# Patient Record
Sex: Female | Born: 1990 | Hispanic: Yes | Smoking: Never smoker
Health system: Southern US, Community
[De-identification: ages and names within clinical notes are randomized; demographics above are authoritative.]

## PROBLEM LIST (undated history)

## (undated) DIAGNOSIS — R87619 Unspecified abnormal cytological findings in specimens from cervix uteri: Secondary | ICD-10-CM

## (undated) DIAGNOSIS — R112 Nausea with vomiting, unspecified: Secondary | ICD-10-CM

## (undated) DIAGNOSIS — K0889 Other specified disorders of teeth and supporting structures: Secondary | ICD-10-CM

## (undated) HISTORY — DX: Other specified disorders of teeth and supporting structures: K08.89

## (undated) HISTORY — DX: Nausea with vomiting, unspecified: R11.2

## (undated) HISTORY — DX: Unspecified abnormal cytological findings in specimens from cervix uteri: R87.619

---

## 2018-08-13 LAB — OB RESULTS CONSOLE HGB/HCT, BLOOD: Hemoglobin: 13.2

## 2018-08-14 LAB — OB RESULTS CONSOLE RPR: RPR: NONREACTIVE

## 2018-08-14 LAB — OB RESULTS CONSOLE ABO/RH
ABO/RH(D): O POS
RH Type: POSITIVE

## 2018-08-14 LAB — OB RESULTS CONSOLE HGB/HCT, BLOOD: HCT: 38 (ref 29–41)

## 2018-08-14 LAB — OB RESULTS CONSOLE HEPATITIS B SURFACE ANTIGEN: Hepatitis B Surface Ag: NEGATIVE

## 2018-08-14 LAB — OB RESULTS CONSOLE GC/CHLAMYDIA
Chlamydia: NEGATIVE
Gonorrhea: NEGATIVE

## 2018-08-14 LAB — OB RESULTS CONSOLE RUBELLA ANTIBODY, IGM: Rubella: IMMUNE

## 2018-08-14 LAB — OB RESULTS CONSOLE PLATELET COUNT: Platelets: 401000

## 2018-08-14 LAB — OB RESULTS CONSOLE ANTIBODY SCREEN: Antibody Screen: NEGATIVE

## 2018-08-14 LAB — OB RESULTS CONSOLE VARICELLA ZOSTER ANTIBODY, IGG: Varicella: IMMUNE

## 2018-08-14 LAB — OB RESULTS CONSOLE HIV ANTIBODY (ROUTINE TESTING): HIV: NONREACTIVE

## 2018-08-15 ENCOUNTER — Other Ambulatory Visit: Payer: Self-pay | Admitting: Advanced Practice Midwife

## 2018-08-15 DIAGNOSIS — Z3481 Encounter for supervision of other normal pregnancy, first trimester: Secondary | ICD-10-CM

## 2018-08-22 ENCOUNTER — Other Ambulatory Visit: Payer: Self-pay

## 2018-08-22 ENCOUNTER — Ambulatory Visit
Admission: RE | Admit: 2018-08-22 | Discharge: 2018-08-22 | Disposition: A | Discharge status: Home / Self Care | Payer: Self-pay | Source: Ambulatory Visit | Attending: Advanced Practice Midwife | Admitting: Advanced Practice Midwife

## 2018-08-22 DIAGNOSIS — Z3481 Encounter for supervision of other normal pregnancy, first trimester: Secondary | ICD-10-CM | POA: Insufficient documentation

## 2018-09-10 ENCOUNTER — Other Ambulatory Visit: Payer: Self-pay | Admitting: Advanced Practice Midwife

## 2018-09-10 DIAGNOSIS — Z3482 Encounter for supervision of other normal pregnancy, second trimester: Secondary | ICD-10-CM

## 2018-09-24 ENCOUNTER — Ambulatory Visit: Payer: Self-pay

## 2018-10-04 ENCOUNTER — Other Ambulatory Visit: Payer: Self-pay

## 2018-10-04 ENCOUNTER — Ambulatory Visit
Admission: RE | Admit: 2018-10-04 | Discharge: 2018-10-04 | Disposition: A | Discharge status: Home / Self Care | Payer: Self-pay | Source: Ambulatory Visit | Attending: Advanced Practice Midwife | Admitting: Advanced Practice Midwife

## 2018-10-04 DIAGNOSIS — Z3482 Encounter for supervision of other normal pregnancy, second trimester: Secondary | ICD-10-CM | POA: Insufficient documentation

## 2018-10-08 LAB — OB RESULTS CONSOLE HGB/HCT, BLOOD
HCT: 34 (ref 29–41)
Hemoglobin: 11.6

## 2018-10-08 LAB — OB RESULTS CONSOLE PLATELET COUNT: Platelets: 374000

## 2018-12-03 DIAGNOSIS — Z3482 Encounter for supervision of other normal pregnancy, second trimester: Secondary | ICD-10-CM

## 2018-12-03 DIAGNOSIS — Z3480 Encounter for supervision of other normal pregnancy, unspecified trimester: Secondary | ICD-10-CM | POA: Insufficient documentation

## 2018-12-03 DIAGNOSIS — D72829 Elevated white blood cell count, unspecified: Secondary | ICD-10-CM | POA: Insufficient documentation

## 2018-12-03 DIAGNOSIS — Z98891 History of uterine scar from previous surgery: Secondary | ICD-10-CM | POA: Insufficient documentation

## 2018-12-03 DIAGNOSIS — Z550 Illiteracy and low-level literacy: Secondary | ICD-10-CM

## 2018-12-03 DIAGNOSIS — Z6790 Unspecified blood type, Rh positive: Secondary | ICD-10-CM | POA: Insufficient documentation

## 2018-12-10 ENCOUNTER — Other Ambulatory Visit (HOSPITAL_COMMUNITY): Payer: Self-pay | Admitting: Family Medicine

## 2018-12-10 ENCOUNTER — Other Ambulatory Visit: Payer: Self-pay

## 2018-12-10 ENCOUNTER — Ambulatory Visit: Payer: Self-pay | Admitting: Physician Assistant

## 2018-12-10 VITALS — BP 95/57 | Temp 97.4°F | Wt 113.4 lb

## 2018-12-10 DIAGNOSIS — Z550 Illiteracy and low-level literacy: Secondary | ICD-10-CM

## 2018-12-10 DIAGNOSIS — D72829 Elevated white blood cell count, unspecified: Secondary | ICD-10-CM

## 2018-12-10 DIAGNOSIS — Z98891 History of uterine scar from previous surgery: Secondary | ICD-10-CM

## 2018-12-10 DIAGNOSIS — Z3403 Encounter for supervision of normal first pregnancy, third trimester: Secondary | ICD-10-CM

## 2018-12-10 DIAGNOSIS — O99810 Abnormal glucose complicating pregnancy: Secondary | ICD-10-CM

## 2018-12-10 DIAGNOSIS — Z3480 Encounter for supervision of other normal pregnancy, unspecified trimester: Secondary | ICD-10-CM

## 2018-12-10 LAB — OB RESULTS CONSOLE HIV ANTIBODY (ROUTINE TESTING): HIV: NONREACTIVE

## 2018-12-10 MED ORDER — PRENATAL VITAMINS 28-0.8 MG PO TABS: 1.0000 | ORAL_TABLET | Freq: Every day | ORAL | 0 refills | Status: DC

## 2018-12-10 NOTE — Progress Notes (Signed)
Negative responses to Covid-19 screening questions. Denies international travel for self or FOB since pregnant. 28 week labs today. Counseled regarding Tdap recommendations for those people who will be around infant. Client tolerated Tdap without difficulty.

## 2018-12-11 LAB — CBC WITH DIFFERENTIAL/PLATELET
Basophils Absolute: 0 10*3/uL (ref 0.0–0.2)
Basos: 0 %
EOS (ABSOLUTE): 0.6 10*3/uL — ABNORMAL HIGH (ref 0.0–0.4)
Eos: 5 %
Hematocrit: 34.4 % (ref 34.0–46.6)
Hemoglobin: 11.3 g/dL (ref 11.1–15.9)
Immature Grans (Abs): 0.1 10*3/uL (ref 0.0–0.1)
Immature Granulocytes: 1 %
Lymphocytes Absolute: 2.6 10*3/uL (ref 0.7–3.1)
Lymphs: 23 %
MCH: 30.6 pg (ref 26.6–33.0)
MCHC: 32.8 g/dL (ref 31.5–35.7)
MCV: 93 fL (ref 79–97)
Monocytes Absolute: 0.8 10*3/uL (ref 0.1–0.9)
Monocytes: 7 %
Neutrophils Absolute: 7.4 10*3/uL — ABNORMAL HIGH (ref 1.4–7.0)
Neutrophils: 64 %
Platelets: 358 10*3/uL (ref 150–450)
RBC: 3.69 x10E6/uL — ABNORMAL LOW (ref 3.77–5.28)
RDW: 12.7 % (ref 11.7–15.4)
WBC: 11.5 10*3/uL — ABNORMAL HIGH (ref 3.4–10.8)

## 2018-12-11 LAB — GLUCOSE, 1 HOUR GESTATIONAL: Gestational Diabetes Screen: 147 mg/dL — ABNORMAL HIGH (ref 65–139)

## 2018-12-11 LAB — RPR: RPR Ser Ql: NONREACTIVE

## 2018-12-12 ENCOUNTER — Telehealth: Payer: Self-pay

## 2018-12-12 DIAGNOSIS — O99810 Abnormal glucose complicating pregnancy: Secondary | ICD-10-CM | POA: Insufficient documentation

## 2018-12-12 LAB — HEMOGLOBIN, FINGERSTICK: Hemoglobin: 11.4 g/dL (ref 11.1–15.9)

## 2018-12-12 NOTE — Telephone Encounter (Signed)
Pt. Returned call-discussed 3 Hr Gtt and is scheduled for 12/19/18.  Aware to be fasting; Interpreter by Jerilynn Mages. [place].

## 2018-12-12 NOTE — Telephone Encounter (Signed)
Needs 3 Hr Gtt--scheduled appt for 12/19/18 @ 8:15 if that will work.

## 2018-12-13 NOTE — Progress Notes (Signed)
  PRENATAL VISIT NOTE  Subjective:  Angela Terry is a 28 y.o. G2P1001 at [redacted]w[redacted]d being seen today for ongoing prenatal care.  She is currently monitored for the following issues for this low-risk pregnancy and has Supervision of other normal pregnancy, antepartum; Previous cesarean section; Elevated white blood cell count; Blood type, Rh positive; Illiteracy; and Abnormal glucose tolerance affecting pregnancy, antepartum on their problem list.  Patient reports no complaints.  Contractions: Not present. Vag. Bleeding: None.  Movement: Present. denies leaking of fluid/ROM.   The following portions of the patient's history were reviewed and updated as appropriate: allergies, current medications, past family history, past medical history, past social history, past surgical history and problem list. Problem list updated.  Objective:   Vitals:   12/10/18 1615  BP: (!) 95/57  Temp: (!) 97.4 F (36.3 C)  Weight: 113 lb 6.4 oz (51.4 kg)    Fetal Status: Fetal Heart Rate (bpm): 156 Fundal Height: 28 cm Movement: Present     General:  Alert, oriented and cooperative. Patient is in no acute distress.  Skin: Skin is warm and dry. No rash noted.   Cardiovascular: Normal heart rate noted  Respiratory: Normal respiratory effort, no problems with respiration noted  Abdomen: Soft, gravid, appropriate for gestational age.  Pain/Pressure: Absent     Pelvic: Cervical exam deferred        Extremities: Normal range of motion.  Edema: None  Mental Status: Normal mood and affect. Normal behavior. Normal judgment and thought content.   Assessment and Plan:  Pregnancy: G2P1001 at [redacted]w[redacted]d  1. Supervision of other normal pregnancy, antepartum Here for routine visit, feeling well, no concerns. - Tdap vaccine greater than or equal to 7yo IM - CBC with Differential/Platelet - HIV/HCV [facility] - RPR - Prenatal Vit-Fe Fumarate-FA (PRENATAL VITAMINS) 28-0.8 MG TABS; Take 1 tablet by mouth daily at  6 (six) AM.  Dispense: 100 tablet; Refill: 0 - Hemoglobin - Glucose, 1 hour gestational  2. Leukocytosis, unspecified type Repeated CBC 6/29 and reviewed after visit, WNL. No F/u needed for now.  3. Previous cesarean section Desires repeat C/S; pt will need del plans appt at [facility] @ 32 wk  4. Illiteracy Strong historian and verbal communication via interpreter.  5. Abnormal glucose tolerance affecting pregnancy, antepartum [place] reviewed after visit and elevated; sched 3h OGTT.   Preterm labor symptoms and general obstetric precautions including but not limited to vaginal bleeding, contractions, leaking of fluid and fetal movement were reviewed in detail with the patient. Please refer to After Visit Summary for other counseling recommendations.  Return in about 2 weeks (around 12/24/2018) for Routine prenatal care, Telehealth.  Future Appointments  Date Time Provider [place]  12/19/2018  8:40 AM AC-MH NURSE AC-MAT None  12/24/2018 10:20 AM AC-MH PROVIDER AC-MAT None    Jettie Pagan, PA-C

## 2018-12-13 NOTE — Patient Instructions (Signed)
Tercer trimestre de embarazo Third Trimester of Pregnancy  El tercer trimestre comprende desde la semana28 hasta la semana40 (desde el mes7 hasta el mes9). En este trimestre, el beb en gestacin (feto) crece muy rpidamente. Hacia el final del noveno mes, el beb en gestacin mide alrededor de 20pulgadas (45cm) de largo. Pesa entre 6y 10libras (2,70y 4,50kg). Siga estas indicaciones en su casa: Medicamentos  Tome los medicamentos de venta libre y los recetados solamente como se lo haya indicado el mdico. Algunos medicamentos son seguros para tomar durante el embarazo y otros no lo son.  Tome vitaminas prenatales que contengan por lo menos 600microgramos (?g) de cido flico.  Si tiene dificultad para mover el intestino (estreimiento), tome un medicamento para ablandar las heces (laxante) si su mdico se lo autoriza. Comida y bebida   Ingiera alimentos saludables de manera regular.  No coma carne cruda ni quesos sin cocinar.  Si obtiene poca cantidad de calcio de los alimentos que ingiere, consulte a su mdico sobre la posibilidad de tomar un suplemento diario de calcio.  La ingesta diaria de cuatro o cinco comidas pequeas en lugar de tres comidas abundantes.  Evite el consumo de alimentos ricos en grasas y azcares, como los alimentos fritos y los dulces.  Para evitar el estreimiento: ? Consuma alimentos ricos en fibra, como frutas y verduras frescas, cereales integrales y frijoles. ? Beba suficiente lquido para mantener el pis (orina) claro o de color amarillo plido. Actividad  Haga ejercicios solamente como se lo haya indicado el mdico. Interrumpa la actividad fsica si comienza a tener calambres.  No levante objetos pesados, use zapatos de tacones bajos y sintese derecha.  No haga ejercicio si hace demasiado calor, hay demasiada humedad o se encuentra en un lugar de mucha altura (altitud alta).  Puede continuar teniendo relaciones sexuales, a menos que el  mdico le indique lo contrario. Alivio del dolor y del malestar  Use un sostn que le brinde buen soporte si sus mamas estn sensibles.  Haga pausas frecuentes y descanse con las piernas levantadas si tiene calambres en las piernas o dolor en la zona lumbar.  Dese baos de asiento con agua tibia para aliviar el dolor o las molestias causadas por las hemorroides. Use una crema para las hemorroides si el mdico la autoriza.  Si desarrolla venas hinchadas y abultadas (vrices) en las piernas: ? Use medias de compresin o medias de descanso como se lo haya indicado el mdico. ? Levante (eleve) los pies durante 15minutos, 3 o 4veces por da. ? Limite el consumo de sal en sus alimentos. Seguridad  Colquese el cinturn de seguridad cuando conduzca.  Haga una lista de los nmeros de telfono de emergencia, que incluya los nmeros de telfono de familiares, amigos, el hospital, as como los departamentos de polica y bomberos. Preparacin para la llegada del beb Para prepararse para la llegada de su beb:  Tome clases prenatales.  Practique ir manejando al hospital.  Visite el hospital y recorra el rea de maternidad.  Hable en su trabajo acerca de tomar licencia cuando llegue el beb.  Prepare el bolso que llevar al hospital.  Prepare la habitacin del beb.  Concurra a los controles mdicos.  Compre un asiento de seguridad orientado hacia atrs para llevar al beb en el automvil. Aprenda cmo instalarlo en el auto. Instrucciones generales  No se d baos de inmersin en agua caliente, baos turcos ni saunas.  No consuma ningn producto que contenga nicotina o tabaco, como cigarrillos y cigarrillos   electrnicos. Si necesita ayuda para dejar de fumar, consulte al mdico.  No beba alcohol.  No se haga duchas vaginales ni use tampones o toallas higinicas perfumadas.  No mantenga las piernas cruzadas durante mucho tiempo.  No haga viajes de larga distancia, excepto si es  obligatorio. Hgalos solamente si su mdico la autoriza.  Visite a su dentista si no lo ha hecho durante el embarazo. Use un cepillo de cerdas suaves para cepillarse los dientes. Psese el hilo dental con suavidad.  Evite el contacto con las bandejas sanitarias de los gatos y la tierra que estos animales usan. Estos elementos contienen bacterias que pueden causar defectos congnitos al beb y la posible prdida del beb (aborto espontneo) o la muerte fetal.  Concurra a todas las visitas prenatales como se lo haya indicado el mdico. Esto es importante. Comunquese con un mdico si:  No est segura de si est en trabajo de parto o si ha roto la bolsa de las aguas.  Tiene mareos.  Tiene clicos leves o siente presin en la parte baja del vientre.  Sufre un dolor persistente en el abdomen.  Sigue teniendo malestar estomacal, vomita o tiene heces lquidas.  Advierte un lquido con olor ftido que proviene de la vagina.  Siente dolor al orinar. Solicite ayuda de inmediato si:  Tiene fiebre.  Tiene una prdida de lquido por la vagina.  Tiene sangrado o pequeas prdidas vaginales.  Siente dolor intenso o clicos en el abdomen.  Aumenta o baja de peso rpidamente.  Tiene dificultades para recuperar el aliento y siente dolor en el pecho.  Sbitamente se le hinchan mucho el rostro, las manos, los tobillos, los pies o las piernas.  No ha sentido los movimientos del beb durante una hora.  Siente un dolor de cabeza intenso que no se alivia con medicamentos.  Tiene dificultad para ver.  Tiene prdida de lquido o le sale un chorro de lquido de la vagina antes de estar en la semana 37.  Tiene espasmos abdominales (contracciones) regulares antes de estar en la semana 37. Resumen  El tercer trimestre comprende desde la semana28 hasta la semana40 (desde el mes7 hasta el mes9). Esta es la poca en que el beb en gestacin crece muy rpidamente.  Siga los consejos del mdico  con respecto a los medicamentos, la alimentacin y la actividad.  Preprese para la llegada del beb tomando las clases prenatales, preparando todo lo que necesitar el beb, arreglando la habitacin del beb y concurriendo a los controles mdicos.  Solicite ayuda de inmediato si tiene sangrado por la vagina, siente dolor en el pecho o tiene dificultad para respirar, o si no ha sentido que su beb se mueve en el transcurso de ms de una hora. Esta informacin no tiene como fin reemplazar el consejo del mdico. Asegrese de hacerle al mdico cualquier pregunta que tenga. Document Released: 01/30/2013 Document Revised: 01/02/2017 Document Reviewed: 01/02/2017 Elsevier Patient Education  2020 Elsevier Inc.  

## 2018-12-19 ENCOUNTER — Other Ambulatory Visit: Payer: Self-pay

## 2018-12-19 ENCOUNTER — Other Ambulatory Visit: Payer: Self-pay | Admitting: Nurse Practitioner

## 2018-12-19 DIAGNOSIS — Z3480 Encounter for supervision of other normal pregnancy, unspecified trimester: Secondary | ICD-10-CM

## 2018-12-19 NOTE — Progress Notes (Signed)
In for 3 Hr Gtt; confirmed is fasting and instructions given.

## 2018-12-20 LAB — GLUCOSE TOLERANCE TEST, 6 HOUR
Glucose, 1 Hour GTT: 136 mg/dL (ref 65–199)
Glucose, 2 hour: 92 mg/dL (ref 65–139)
Glucose, 3 hour: 79 mg/dL (ref 65–109)
Glucose, GTT - Fasting: 71 mg/dL (ref 65–99)

## 2018-12-22 NOTE — Progress Notes (Signed)
Omitted delivery group from overview - added [facility].

## 2018-12-24 ENCOUNTER — Ambulatory Visit: Payer: Self-pay | Admitting: Nurse Practitioner

## 2018-12-24 ENCOUNTER — Other Ambulatory Visit: Payer: Self-pay

## 2018-12-24 DIAGNOSIS — Z3480 Encounter for supervision of other normal pregnancy, unspecified trimester: Secondary | ICD-10-CM

## 2018-12-24 DIAGNOSIS — O99810 Abnormal glucose complicating pregnancy: Secondary | ICD-10-CM

## 2018-12-24 NOTE — Progress Notes (Signed)
  Nursing Staff Provider  Office Location  ACHD Dating    Language  Spanish Anatomy US    Flu Vaccine   Genetic Screen  NIPS:   AFP:   First Screen:  Quad:    TDaP vaccine    Hgb A1C or  GTT Early  Third trimester   Rhogam     LAB RESULTS   Feeding Plan  Blood Type O/Positive/O positive (03/03 0000)   Contraception  Antibody Negative (03/03 0000)  Circumcision  Rubella Immune (03/03 0000)  Pediatrician   RPR Non Reactive (06/29 1714)   Support Person  HBsAg Negative (03/03 0000)   Prenatal Classes  HIV Non-reactive (03/03 0000)  BTL Consent  GBS  (For PCN allergy, check sensitivities)   VBAC Consent  Pap      Hgb Electro      CF   Delivery Group   SMA   Centering Group  N/A       STI clinic/screening visit  Subjective:  Angela Terry is a 28 y.o. female being seen today for an STI screening visit. The patient reports they do not have symptoms.  Patient has the following medical conditionshas Supervision of other normal pregnancy, antepartum; Previous cesarean section; Elevated white blood cell count; Blood type, Rh positive; Illiteracy; and Abnormal glucose tolerance affecting pregnancy, antepartum on their problem list.  Chief Complaint  Patient presents with  . Routine Prenatal Visit    Telehealth - [redacted] wks gestation    Patient reports  HPI   See flowsheet for further details and programmatic requirements.    The following portions of the patient's history were reviewed and updated as appropriate: allergies, current medications, past family history, past medical history, past social history, past surgical history and problem list. Problem list updated.  Objective:  There were no vitals filed for this visit.  Physical Exam    Assessment and Plan:  Angela Terry is a 28 y.o. female presenting to the [facility] Department for STI screening    1. Supervision of other normal pregnancy, antepartum Client doing well Denies any  additional concerns at this time Plan for in-house visit  Client verbalizes understanding and is in agreement with plan of care      No follow-ups on file.  Future Appointments  Date Time Provider [place]  01/07/2019 10:00 AM AC-MH PROVIDER AC-MAT None    Berniece Andreas, NP

## 2018-12-24 NOTE — Progress Notes (Signed)
Telephone call to patient for telehealth appt. Patient verified using 2 identifiers. Patient states she is at home and available to talk. Patient states she has had PTL education and the purple handout sheet. Needs CCNC and PHQ9 at next visit. Patient next visit scheduled, and patient available for provider call.Jenetta Downer, RN

## 2018-12-24 NOTE — Progress Notes (Signed)
STI clinic/screening visit  Subjective:  Angela Terry is a 28 y.o. female being seen today for an STI screening visit. The patient reports they do not have symptoms.  Patient has the following medical conditionshas Supervision of other normal pregnancy, antepartum; Previous cesarean section; Elevated white blood cell count; Blood type, Rh positive; Illiteracy; and Abnormal glucose tolerance affecting pregnancy, antepartum on their problem list.  Chief Complaint  Patient presents with  . Routine Prenatal Visit    Telehealth - [redacted] wks gestation    Patient reports  HPI   See flowsheet for further details and programmatic requirements.    The following portions of the patient's history were reviewed and updated as appropriate: allergies, current medications, past family history, past medical history, past social history, past surgical history and problem list. Problem list updated.  Objective:  There were no vitals filed for this visit.  Physical Exam    Assessment and Plan:  Angela Terry is a 28 y.o. female presenting to the [facility] Department for STI screening   TELEPHONE OBSTETRICS VISIT ENCOUNTER NOTE  I connected with@ on 12/24/18 at 10:20 AM EDT by telephone at home and verified that I am speaking with the correct person using two identifiers.   I discussed the limitations, risks, security and privacy concerns of performing an evaluation and management service by telephone and the availability of in person appointments. I also discussed with the patient that there may be a patient responsible charge related to this service. The patient expressed understanding and agreed to proceed.  Subjective:  Angela Terry is a 28 y.o. G2P1001 at [redacted]w[redacted]d being followed for ongoing prenatal care.  She is currently monitored for the following issues for this low-risk pregnancy and has Supervision of other normal pregnancy, antepartum;  Previous cesarean section; Elevated white blood cell count; Blood type, Rh positive; Illiteracy; and Abnormal glucose tolerance affecting pregnancy, antepartum on their problem list.  Patient reports no complaints. Reports fetal movement. Denies any contractions, bleeding or leaking of fluid.   The following portions of the patient's history were reviewed and updated as appropriate: allergies, current medications, past family history, past medical history, past social history, past surgical history and problem list.   Objective:   General:  Alert, oriented and cooperative.   Mental Status: Normal mood and affect perceived. Normal judgment and thought content.  Rest of physical exam deferred due to type of encounter  Assessment and Plan:  Pregnancy: G2P1001 at [redacted]w[redacted]d   Preterm labor symptoms and general obstetric precautions including but not limited to vaginal bleeding, contractions, leaking of fluid and fetal movement were reviewed in detail with the patient.  I discussed the assessment and treatment plan with the patient. The patient was provided an opportunity to ask questions and all were answered. The patient agreed with the plan and demonstrated an understanding of the instructions. The patient was advised to call back or seek an in-person office evaluation/go to the hospital for any urgent or concerning symptoms.  Please refer to After Visit Summary for other counseling recommendations.   I provided 6 minutes of non-face-to-face time during this encounter.  Return in about 2 weeks (around 01/07/2019) for routine prenatal care.  Future Appointments  Date Time Provider [place]  01/07/2019 10:00 AM AC-MH PROVIDER AC-MAT None    Berniece Andreas, NP

## 2019-01-07 ENCOUNTER — Other Ambulatory Visit: Payer: Self-pay

## 2019-01-07 ENCOUNTER — Ambulatory Visit: Payer: Self-pay | Admitting: Nurse Practitioner

## 2019-01-07 VITALS — BP 93/57 | Temp 97.6°F | Wt 116.6 lb

## 2019-01-07 DIAGNOSIS — Z3480 Encounter for supervision of other normal pregnancy, unspecified trimester: Secondary | ICD-10-CM

## 2019-01-07 NOTE — Progress Notes (Signed)
     PRENATAL VISIT NOTE  Subjective:  Angela Terry is a 28 y.o. G2P1001 at [redacted]w[redacted]d being seen today for ongoing prenatal care.  She is currently monitored for the following issues for this high-risk pregnancy and has Supervision of other normal pregnancy, antepartum; Previous cesarean section; Elevated white blood cell count; Blood type, Rh positive; Illiteracy; and Abnormal glucose tolerance affecting pregnancy, antepartum on their problem list.  Patient reports Questions about kick count cards.   .  .   . Denies leaking of fluid/ROM.   The following portions of the patient's history were reviewed and updated as appropriate: allergies, current medications, past family history, past medical history, past social history, past surgical history and problem list. Problem list updated.  Objective:   Vitals:   01/07/19 0952  BP: (!) 93/57  Temp: 97.6 F (36.4 C)  Weight: 116 lb 9.6 oz (52.9 kg)    Fetal Status:           General:  Alert, oriented and cooperative. Patient is in no acute distress.  Skin: Skin is warm and dry. No rash noted.   Cardiovascular: Normal heart rate noted  Respiratory: Normal respiratory effort, no problems with respiration noted  Abdomen: Soft, gravid, appropriate for gestational age.        Pelvic: Cervical exam deferred        Extremities: Normal range of motion.     Mental Status: Normal mood and affect. Normal behavior. Normal judgment and thought content.   Assessment and Plan:  Pregnancy: G2P1001 at [redacted]w[redacted]d  1. Supervision of other normal pregnancy  Discussed kick counts card OB ROS flowsheet completed Denies headaches and vaginal discharge  Preterm labor symptoms and general obstetric precautions including but not limited to vaginal bleeding, contractions, leaking of fluid and fetal movement were reviewed in detail with the patient. Please refer to After Visit Summary for other counseling recommendations.  No follow-ups on file.  No  future appointments.  Berniece Andreas, NP

## 2019-01-07 NOTE — Progress Notes (Signed)
Taking PNV QD, denies ED/hospital visits since last RV. Kick count cards and instructions given. CCNC and PHQ9 forms completed today. Needs [place] delivery plans appt ~ 36 weeks.  Hal Morales, RN

## 2019-01-21 ENCOUNTER — Ambulatory Visit: Payer: Self-pay | Admitting: Nurse Practitioner

## 2019-01-21 ENCOUNTER — Other Ambulatory Visit: Payer: Self-pay

## 2019-01-21 DIAGNOSIS — O99810 Abnormal glucose complicating pregnancy: Secondary | ICD-10-CM

## 2019-01-21 DIAGNOSIS — Z3480 Encounter for supervision of other normal pregnancy, unspecified trimester: Secondary | ICD-10-CM

## 2019-01-21 NOTE — Progress Notes (Signed)
   TELEPHONE OBSTETRICS VISIT ENCOUNTER NOTE  I connected with Angela Terry on 01/21/19 at  1:20 PM EDT by telephone at home and verified that I am speaking with the correct person using two identifiers.   I discussed the limitations, risks, security and privacy concerns of performing an evaluation and management service by telephone and the availability of in person appointments. I also discussed with the patient that there may be a patient responsible charge related to this service. The patient expressed understanding and agreed to proceed.  Subjective:  Angela Terry is a 28 y.o. G2P1001 at [redacted]w[redacted]d being followed for ongoing prenatal care.  She is currently monitored for the following issues for this low-risk pregnancy and has Supervision of other normal pregnancy, antepartum; Previous cesarean section; Elevated white blood cell count; Blood type, Rh positive; Illiteracy; and Abnormal glucose tolerance affecting pregnancy, antepartum on their problem list.  Patient reports no complaints. Reports fetal movement. Denies any contractions, bleeding or leaking of fluid.   The following portions of the patient's history were reviewed and updated as appropriate: allergies, current medications, past family history, past medical history, past social history, past surgical history and problem list.   Objective:   General:  Alert, oriented and cooperative.   Mental Status: Normal mood and affect perceived. Normal judgment and thought content.  Rest of physical exam deferred due to type of encounter  Assessment and Plan:  Pregnancy: G2P1001 at [redacted]w[redacted]d   1. Supervision of other normal pregnancy, antepartum Client admits to doing well Denies any complaints at this time   Preterm labor symptoms and general obstetric precautions including but not limited to vaginal bleeding, contractions, leaking of fluid and fetal movement were reviewed in detail with the patient.  I discussed  the assessment and treatment plan with the patient. The patient was provided an opportunity to ask questions and all were answered. The patient agreed with the plan and demonstrated an understanding of the instructions. The patient was advised to call back or seek an in-person office evaluation/go to the hospital for any urgent or concerning symptoms.  Please refer to After Visit Summary for other counseling recommendations.   I provided 7 minutes of non-face-to-face time during this encounter.  No follow-ups on file.  Future Appointments  Date Time Provider [place]  02/04/2019 10:40 AM AC-MH PROVIDER AC-MAT None    Berniece Andreas, NP

## 2019-01-21 NOTE — Progress Notes (Signed)
Telehealth phone call to patient. Identified patient as well as current location as home address. Patient states she is comfortable discussing prenatal care information over the phone and at current location. Patient states she tested positive for Covid-19 ~ 2  Weeks ago and is off of isolation as of yesterday. States her husband tested positive first but that was before she had symptoms and that child has tested negative. Patient states she does not have scales and home BP cuff to assess weight and BP at home. Patient states she is taking PNV and denies ED/hospital visits since last RV. 2 week MH RV scheduled for 02/04/2019 @ 10:40. Instructed patent to arrive at 10:20 for check in. Instructed patient to remain at this same phone number for provider to call and completed provider portion of Telehealth appt. Hal Morales, RN

## 2019-02-04 ENCOUNTER — Ambulatory Visit: Payer: Self-pay | Admitting: Advanced Practice Midwife

## 2019-02-04 ENCOUNTER — Encounter: Payer: Self-pay | Admitting: Advanced Practice Midwife

## 2019-02-04 ENCOUNTER — Other Ambulatory Visit: Payer: Self-pay

## 2019-02-04 VITALS — BP 102/58 | Temp 98.9°F | Wt 120.6 lb

## 2019-02-04 DIAGNOSIS — Z3480 Encounter for supervision of other normal pregnancy, unspecified trimester: Secondary | ICD-10-CM

## 2019-02-04 DIAGNOSIS — O99810 Abnormal glucose complicating pregnancy: Secondary | ICD-10-CM

## 2019-02-04 DIAGNOSIS — Z98891 History of uterine scar from previous surgery: Secondary | ICD-10-CM

## 2019-02-04 NOTE — Progress Notes (Signed)
   PRENATAL VISIT NOTE  Subjective:  Angela Terry is a 28 y.o. G2P1001 at [redacted]w[redacted]d being seen today for ongoing prenatal care.  She is currently monitored for the following issues for this low-risk pregnancy and has Supervision of other normal pregnancy, antepartum; Previous cesarean section; Elevated white blood cell count; Blood type, Rh positive; Illiteracy; and Abnormal glucose tolerance affecting pregnancy, antepartum on their problem list.  Patient reports no complaints.  Contractions: Not present. Vag. Bleeding: None.  Movement: Present. Denies leaking of fluid/ROM.   The following portions of the patient's history were reviewed and updated as appropriate: allergies, current medications, past family history, past medical history, past social history, past surgical history and problem list. Problem list updated.  Objective:   Vitals:   02/04/19 1043  BP: (!) 102/58  Temp: 98.9 F (37.2 C)  Weight: 120 lb 9.6 oz (54.7 kg)    Fetal Status: Fetal Heart Rate (bpm): 120 Fundal Height: 34 cm Movement: Present     General:  Alert, oriented and cooperative. Patient is in no acute distress.  Skin: Skin is warm and dry. No rash noted.   Cardiovascular: Normal heart rate noted  Respiratory: Normal respiratory effort, no problems with respiration noted  Abdomen: Soft, gravid, appropriate for gestational age.  Pain/Pressure: Absent     Pelvic: Cervical exam deferred        Extremities: Normal range of motion.  Edema: None  Mental Status: Normal mood and affect. Normal behavior. Normal judgment and thought content.   Assessment and Plan:  Pregnancy: G2P1001 at [redacted]w[redacted]d  1. Abnormal glucose tolerance affecting pregnancy, antepartum 3 hr GTT on 12/19/18=wnl  2. Supervision of other normal pregnancy, antepartum Feels well. Reviewed pregnancy sx and questions.  No car seat yet.  Desires DMPA pp for birth control.  Knows when to go to L&D - GBS Culture - Chlamydia/GC NAA,  Confirmation  3. Previous cesarean section Hx c/s for FTP at 40 2/7.  Pt desires repeat c/s.  Referral written for delivery plans appt with [facility]   Preterm labor symptoms and general obstetric precautions including but not limited to vaginal bleeding, contractions, leaking of fluid and fetal movement were reviewed in detail with the patient. Please refer to After Visit Summary for other counseling recommendations.  Return in about 1 week (around 02/11/2019) for routine PNC.  Future Appointments  Date Time Provider [place]  02/11/2019 10:40 AM [facility] PROVIDER [facility] None    Herbie Saxon, CNM

## 2019-02-04 NOTE — Progress Notes (Signed)
In for visit; taking PNV; declines self collection of cultures Abijah Roussel, RN  

## 2019-02-07 LAB — CHLAMYDIA/GC NAA, CONFIRMATION
Chlamydia trachomatis, NAA: NEGATIVE
Neisseria gonorrhoeae, NAA: NEGATIVE

## 2019-02-08 LAB — CULTURE, BETA STREP (GROUP B ONLY): Strep Gp B Culture: NEGATIVE

## 2019-02-11 ENCOUNTER — Ambulatory Visit: Payer: Self-pay | Admitting: Advanced Practice Midwife

## 2019-02-11 ENCOUNTER — Ambulatory Visit: Payer: Self-pay

## 2019-02-11 ENCOUNTER — Other Ambulatory Visit: Payer: Self-pay

## 2019-02-11 VITALS — BP 101/65 | Temp 98.7°F | Wt 122.4 lb

## 2019-02-11 DIAGNOSIS — Z98891 History of uterine scar from previous surgery: Secondary | ICD-10-CM

## 2019-02-11 DIAGNOSIS — Z3480 Encounter for supervision of other normal pregnancy, unspecified trimester: Secondary | ICD-10-CM

## 2019-02-11 DIAGNOSIS — O99810 Abnormal glucose complicating pregnancy: Secondary | ICD-10-CM

## 2019-02-11 MED ORDER — PRENATAL VITAMINS 28-0.8 MG PO TABS: 1.0000 | ORAL_TABLET | Freq: Every day | ORAL | 0 refills | Status: AC

## 2019-02-11 NOTE — Progress Notes (Signed)
   PRENATAL VISIT NOTE  Subjective:  Angela Terry is a 28 y.o. G2P1001 at [redacted]w[redacted]d being seen today for ongoing prenatal care.  She is currently monitored for the following issues for this low-risk pregnancy and has Supervision of other normal pregnancy, antepartum; Previous cesarean section; Elevated white blood cell count; Blood type, Rh positive; Illiteracy; and Abnormal glucose tolerance affecting pregnancy, antepartum on their problem list.  Patient reports no complaints.  Contractions: Not present. Vag. Bleeding: None.  Movement: Present. Denies leaking of fluid/ROM.   The following portions of the patient's history were reviewed and updated as appropriate: allergies, current medications, past family history, past medical history, past social history, past surgical history and problem list. Problem list updated.  Objective:   Vitals:   02/11/19 1032  BP: 101/65  Temp: 98.7 F (37.1 C)  Weight: 122 lb 6.4 oz (55.5 kg)    Fetal Status: Fetal Heart Rate (bpm): 130 Fundal Height: 35 cm Movement: Present  Presentation: Vertex  General:  Alert, oriented and cooperative. Patient is in no acute distress.  Skin: Skin is warm and dry. No rash noted.   Cardiovascular: Normal heart rate noted  Respiratory: Normal respiratory effort, no problems with respiration noted  Abdomen: Soft, gravid, appropriate for gestational age.  Pain/Pressure: Absent     Pelvic: Cervical exam deferred        Extremities: Normal range of motion.  Edema: None  Mental Status: Normal mood and affect. Normal behavior. Normal judgment and thought content.   Assessment and Plan:  Pregnancy: G2P1001 at [redacted]w[redacted]d  1. Abnormal glucose tolerance affecting pregnancy, antepartum WNL on 12/19/18  2. Supervision of other normal pregnancy, antepartum Feels well.  Has car seat.  Knows when to go to L&D - Prenatal Vit-Fe Fumarate-FA (PRENATAL VITAMINS) 28-0.8 MG TABS; Take 1 tablet by mouth daily.  Dispense: 100  tablet; Refill: 0  3. Previous cesarean section 2011 in [place].  Wants repeat c/s.  [place] delivery plans appt today  Term labor symptoms and general obstetric precautions including but not limited to vaginal bleeding, contractions, leaking of fluid and fetal movement were reviewed in detail with the patient. Please refer to After Visit Summary for other counseling recommendations.  Return in about 1 week (around 02/18/2019).  No future appointments.  Herbie Saxon, CNM

## 2019-02-11 NOTE — Progress Notes (Signed)
In for visit; taking PNV, more given; has [place] appt. Today Debera Lat, RN

## 2019-02-19 ENCOUNTER — Ambulatory Visit: Payer: Self-pay

## 2019-02-21 ENCOUNTER — Ambulatory Visit: Payer: Self-pay | Admitting: Physician Assistant

## 2019-02-21 ENCOUNTER — Encounter: Payer: Self-pay | Admitting: Physician Assistant

## 2019-02-21 ENCOUNTER — Other Ambulatory Visit: Payer: Self-pay

## 2019-02-21 ENCOUNTER — Encounter
Admission: RE | Admit: 2019-02-21 | Discharge: 2019-02-21 | Disposition: A | Discharge status: Home / Self Care | Payer: Self-pay | Source: Ambulatory Visit | Attending: Obstetrics and Gynecology | Admitting: Obstetrics and Gynecology

## 2019-02-21 VITALS — BP 100/63 | Temp 99.4°F | Wt 124.6 lb

## 2019-02-21 DIAGNOSIS — Z20828 Contact with and (suspected) exposure to other viral communicable diseases: Secondary | ICD-10-CM | POA: Diagnosis not present

## 2019-02-21 DIAGNOSIS — Z0184 Encounter for antibody response examination: Secondary | ICD-10-CM | POA: Insufficient documentation

## 2019-02-21 DIAGNOSIS — Z3480 Encounter for supervision of other normal pregnancy, unspecified trimester: Secondary | ICD-10-CM

## 2019-02-21 DIAGNOSIS — Z98891 History of uterine scar from previous surgery: Secondary | ICD-10-CM

## 2019-02-21 DIAGNOSIS — Z01812 Encounter for preprocedural laboratory examination: Secondary | ICD-10-CM | POA: Insufficient documentation

## 2019-02-21 LAB — BASIC METABOLIC PANEL
Anion gap: 10 (ref 5–15)
BUN: 6 mg/dL (ref 6–20)
CO2: 20 mmol/L — ABNORMAL LOW (ref 22–32)
Calcium: 8.9 mg/dL (ref 8.9–10.3)
Chloride: 106 mmol/L (ref 98–111)
Creatinine, Ser: 0.5 mg/dL (ref 0.44–1.00)
GFR calc Af Amer: >60 mL/min (ref >60)
GFR calc non Af Amer: >60 mL/min (ref >60)
Glucose, Bld: 71 mg/dL (ref 70–99)
Potassium: 3.7 mmol/L (ref 3.5–5.1)
Sodium: 136 mmol/L (ref 135–145)

## 2019-02-21 LAB — TYPE AND SCREEN
ABO/RH(D): O POS
Antibody Screen: NEGATIVE
Extend sample reason: UNDETERMINED

## 2019-02-21 LAB — CBC
HCT: 37 % (ref 36.0–46.0)
Hemoglobin: 12.4 g/dL (ref 12.0–15.0)
MCH: 29.9 pg (ref 26.0–34.0)
MCHC: 33.5 g/dL (ref 30.0–36.0)
MCV: 89.2 fL (ref 80.0–100.0)
Platelets: 338 10*3/uL (ref 150–400)
RBC: 4.15 MIL/uL (ref 3.87–5.11)
RDW: 13.2 % (ref 11.5–15.5)
WBC: 11.7 10*3/uL — ABNORMAL HIGH (ref 4.0–10.5)
nRBC: 0 % (ref 0.0–0.2)

## 2019-02-21 LAB — SARS CORONAVIRUS 2 (TAT 6-24 HRS): SARS Coronavirus 2: NEGATIVE

## 2019-02-21 NOTE — H&P (Signed)
Loneta Tamplin is a 28 y.o. female presenting for repeat LTCS . 39+2 week on  02/25/2019 Last c/s in [place]  OB History    Gravida  2   Para  1   Term  1   Preterm  0   AB  0   Living  1     SAB      TAB      Ectopic      Multiple      Live Births  1          Past Medical History:  Diagnosis Date  . Abnormal Pap smear of cervix    questionable hx of 2018 abnormal PAP at [facility]  . Dental neglect    last exam years ago  . Nausea & vomiting    due to pregnancy   Past Surgical History:  Procedure Laterality Date  . CESAREAN SECTION  02/2010   [place]   Family History: family history is not on file. Social History:  reports that she has never smoked. She has never used smokeless tobacco. She reports previous alcohol use. She reports that she does not use drugs.     Maternal Diabetes: No Genetic Screening: norecords Maternal Ultrasounds/Referrals: Normal Fetal Ultrasounds or other Referrals:  None Maternal Substance Abuse:  No Significant Maternal Medications:  None Significant Maternal Lab Results:  None Other Comments:  None  ROS History   Last menstrual period 05/17/2018. Exam Physical Exam  Prenatal labs: ABO, Rh: O/Positive/O positive (03/03 0000) Antibody: Negative (03/03 0000) Rubella: Immune (03/03 0000) RPR: Non Reactive (06/29 1714)  HBsAg: Negative (03/03 0000)  HIV: Non-reactive (06/29 0000)  GBS:   neg   Assessment/Plan: Elective repeat LTCS  covid screening  Pt has been counseled regarding risks of the procedure . All questions answered  Translator present    Gwen Her Zaden Sako 02/21/2019, 12:03 PM

## 2019-02-21 NOTE — Progress Notes (Signed)
   PRENATAL VISIT NOTE  Subjective:  Angela Terry is a 28 y.o. G2P1001 at [redacted]w[redacted]d being seen today for ongoing prenatal care.  She is currently monitored for the following issues for this low-risk pregnancy and has Supervision of other normal pregnancy, antepartum; Previous cesarean section; Elevated white blood cell count; Blood type, Rh positive; Illiteracy; and Abnormal glucose tolerance affecting pregnancy, antepartum on their problem list.  Patient reports no complaints.  Contractions: Not present. Vag. Bleeding: None.  Movement: Present. Denies leaking of fluid/ROM.   The following portions of the patient's history were reviewed and updated as appropriate: allergies, current medications, past family history, past medical history, past social history, past surgical history and problem list. Problem list updated.  Objective:   Vitals:   02/21/19 1540  BP: 100/63  Temp: 99.4 F (37.4 C)  Weight: 124 lb 9.6 oz (56.5 kg)    Fetal Status: Fetal Heart Rate (bpm): 144 Fundal Height: 39 cm Movement: Present  Presentation: Vertex  General:  Alert, oriented and cooperative. Patient is in no acute distress.  Skin: Skin is warm and dry. No rash noted.   Cardiovascular: Normal heart rate noted  Respiratory: Normal respiratory effort, no problems with respiration noted  Abdomen: Soft, gravid, appropriate for gestational age.  Pain/Pressure: Absent     Pelvic: Cervical exam deferred        Extremities: Normal range of motion.  Edema: None  Mental Status: Normal mood and affect. Normal behavior. Normal judgment and thought content.   Assessment and Plan:  Pregnancy: G2P1001 at [redacted]w[redacted]d  1. Supervision of other normal pregnancy, antepartum Doing well. Anticipate next clinic visit 6wk postpartum. Reviewed hosp visitation policy in light of pandemic. Plans to have FOB with her. Call if questions/concerns prior to delivery.  2. Previous cesarean section To keep C/S appt as sched  02/25/19.   Term labor symptoms and general obstetric precautions including but not limited to vaginal bleeding, contractions, leaking of fluid and fetal movement were reviewed in detail with the patient. Please refer to After Visit Summary for other counseling recommendations.  Return in about 7 weeks (around 04/11/2019) for routine postpartum exam.  No future appointments.  Lora Havens, PA-C

## 2019-02-21 NOTE — Patient Instructions (Addendum)
Your procedure is scheduled ZO:XWRUEAon:Monday 02/25/19  Su procedimiento est programado para: Report to  Presntese a: To find out your arrival time please call [phone]. Para saber su hora de llegada por favor llame al [phone] :  Remember: Instructions that are not followed completely may result in serious medical risk, up to and including death, or upon the discretion of your surgeon and anesthesiologist your surgery may need to be rescheduled.  Recuerde: Las instrucciones que no se siguen completamente Armed forces logistics/support/administrative officerpueden resultar en un riesgo de salud grave, incluyendo hasta la [place] o a discrecin de su cirujano y Scientific laboratory techniciananestesilogo, su ciruga se puede posponer.   __X__ 1. Do not eat food  after midnight. No gum chewing or hard candies.  No coma alimentos  despus de la medianoche.  No mastique chicle ni caramelos  Duros. Puede beber liquidos transparentes como agua, te, cafe negro, jugo de [place] Arthurmanzana     __X__ 2. No alcohol for 24 hours before or after surgery.    No tome alcohol durante las 24 horas antes ni despus de la [place].   ____ 3. Bring all medications with you on the day of surgery if instructed.    Lleve todos los medicamentos con usted el da de su ciruga si se le ha indicado as.   __X__ 4. Notify your doctor if there is any change in your medical condition (cold, fever,                             infections).    Informe a su mdico si hay algn cambio en su condicin mdica (resfriado, fiebre, infecciones).   Do not wear jewelry, make-up, hairpins, clips or nail polish.  No use joyas, maquillajes, pinzas/ganchos para el cabello ni esmalte de uas.  Do not wear lotions, powders, or perfumes. .  No use lociones, polvos o perfumes.  .    Do not shave 48 hours prior to surgery. Men may shave face and neck.  No se afeite 48 horas antes de la [place].  Los hombres pueden Commercial Metals Companyafeitarse la cara y el cuello.   Do not bring valuables to the hospital.   No lleve objetos de valor al  hospital.  [facility] not responsible [facility] any belongings or valuables.  [place] no se hace responsable de ningn tipo de pertenencias u objetos de Licensed conveyancervalor.               Contacts, dentures or bridgework may not be worn into surgery.  Los lentes de [place]contacto, las dentaduras postizas o puentes no se pueden usar en la [place].  Leave your suitcase in the car. After surgery it may be brought to your room.  Deje su maleta en el auto.  Despus de la ciruga podr traerla a su habitacin.  For patients admitted to the hospital, discharge time is determined by your treatment team.  Para los pacientes que sean ingresados al hospital, el tiempo en el cual se le dar de alta es determinado por su                equipo de [place].   Patients discharged the day of surgery will not be allowed to drive home. A los pacientes que se les da de alta el mismo da de la ciruga no se les permitir conducir a Higher education careers advisercasa.   Please read over the following fact sheets that you were given: Por favor lea las siguientes hojas de informacin  que le dieron:    ____ Take these medicines the morning of surgery with A SIP OF WATER:          Tome estas medicinas la maana de la ciruga con [place]:  1.   2.   3.   4.       5.  6.  ____ Fleet Enema (as directed)          Enema de Fleet (segn lo indicado)    __X__ Use CHG Soap as directed          Utilice el jabn de CHG segn lo indicado  ____ Use inhalers on the day of surgery          Use los inhaladores el da de la ciruga  ____ Stop metformin 2 days prior to surgery          Deje de tomar el metformin 2 das antes de la ciruga    ____ Take 1/2 of usual insulin dose the night before surgery and none on the morning of surgery           Tome la mitad de la dosis habitual de insulina la noche antes de la [place] y no tome nada en la maana de la             ciruga  ____ Stop Coumadin/Plavix/aspirin on           Deje de tomar el  Coumadin/Plavix/aspirina el da:  ____ Stop Anti-inflammatories on           Deje de tomar antiinflamatorios el da:   ____ Stop supplements until after surgery            Deje de tomar suplementos hasta despus de la ciruga  ____ Bring C-Pap to the hospital          [place] al hospital

## 2019-02-21 NOTE — Progress Notes (Signed)
In for visit; has C/S scheduled 02/25/19 & had Covid test today Debera Lat, RN

## 2019-02-23 ENCOUNTER — Encounter
Admission: EM | Disposition: A | Discharge status: Home / Self Care | Payer: Self-pay | Source: Home / Self Care | Attending: Obstetrics and Gynecology

## 2019-02-23 ENCOUNTER — Inpatient Hospital Stay
Admission: EM | Admit: 2019-02-23 | Discharge: 2019-02-25 | DRG: 788 | Disposition: A | Discharge status: Home / Self Care | Payer: Medicaid Other | Attending: Obstetrics and Gynecology | Admitting: Obstetrics and Gynecology

## 2019-02-23 ENCOUNTER — Inpatient Hospital Stay: Payer: Medicaid Other | Admitting: Anesthesiology

## 2019-02-23 DIAGNOSIS — Z98891 History of uterine scar from previous surgery: Secondary | ICD-10-CM

## 2019-02-23 DIAGNOSIS — O4292 Full-term premature rupture of membranes, unspecified as to length of time between rupture and onset of labor: Secondary | ICD-10-CM | POA: Diagnosis present

## 2019-02-23 DIAGNOSIS — O34211 Maternal care for low transverse scar from previous cesarean delivery: Secondary | ICD-10-CM | POA: Diagnosis present

## 2019-02-23 DIAGNOSIS — Z9889 Other specified postprocedural states: Secondary | ICD-10-CM

## 2019-02-23 DIAGNOSIS — Z3A39 39 weeks gestation of pregnancy: Secondary | ICD-10-CM | POA: Diagnosis not present

## 2019-02-23 LAB — CBC
HCT: 36 % (ref 36.0–46.0)
Hemoglobin: 12.3 g/dL (ref 12.0–15.0)
MCH: 30.1 pg (ref 26.0–34.0)
MCHC: 34.2 g/dL (ref 30.0–36.0)
MCV: 88.2 fL (ref 80.0–100.0)
Platelets: 326 10*3/uL (ref 150–400)
RBC: 4.08 MIL/uL (ref 3.87–5.11)
RDW: 13.3 % (ref 11.5–15.5)
WBC: 13.2 10*3/uL — ABNORMAL HIGH (ref 4.0–10.5)
nRBC: 0 % (ref 0.0–0.2)

## 2019-02-23 SURGERY — Surgical Case
Anesthesia: Spinal

## 2019-02-23 MED ORDER — TERBUTALINE SULFATE 1 MG/ML IJ SOLN
0.2500 mg | Freq: Once | INTRAMUSCULAR | Status: AC
  Administered 2019-02-23: 0.25 mg via SUBCUTANEOUS
  Filled 2019-02-23: qty 1

## 2019-02-23 MED ORDER — MORPHINE SULFATE (PF) 0.5 MG/ML IJ SOLN
INTRAMUSCULAR | Status: DC | PRN
  Administered 2019-02-23: .1 mg via INTRATHECAL

## 2019-02-23 MED ORDER — PHENYLEPHRINE HCL (PRESSORS) 10 MG/ML IV SOLN
INTRAVENOUS | Status: DC | PRN
  Administered 2019-02-23: 200 ug via INTRAVENOUS

## 2019-02-23 MED ORDER — ONDANSETRON HCL 4 MG/2ML IJ SOLN
INTRAMUSCULAR | Status: AC
  Filled 2019-02-23: qty 2

## 2019-02-23 MED ORDER — MORPHINE SULFATE (PF) 0.5 MG/ML IJ SOLN
INTRAMUSCULAR | Status: AC
  Filled 2019-02-23: qty 10

## 2019-02-23 MED ORDER — BUPIVACAINE LIPOSOME 1.3 % IJ SUSP
20.0000 mL | Freq: Once | INTRAMUSCULAR | Status: DC
  Filled 2019-02-23: qty 20

## 2019-02-23 MED ORDER — DEXAMETHASONE SODIUM PHOSPHATE 10 MG/ML IJ SOLN
INTRAMUSCULAR | Status: AC
  Filled 2019-02-23: qty 1

## 2019-02-23 MED ORDER — OXYTOCIN BOLUS FROM INFUSION: 500.0000 mL | Freq: Once | INTRAVENOUS | Status: DC

## 2019-02-23 MED ORDER — LACTATED RINGERS IV SOLN
INTRAVENOUS | Status: DC
  Administered 2019-02-23: 23:00:00 via INTRAVENOUS

## 2019-02-23 MED ORDER — OXYTOCIN 40 UNITS IN NORMAL SALINE INFUSION - SIMPLE MED
2.5000 [IU]/h | INTRAVENOUS | Status: DC
  Administered 2019-02-24: 1000 mL via INTRAVENOUS
  Filled 2019-02-23: qty 1000

## 2019-02-23 MED ORDER — SOD CITRATE-CITRIC ACID 500-334 MG/5ML PO SOLN
ORAL | Status: AC
  Administered 2019-02-23: 15 mL
  Filled 2019-02-23: qty 15

## 2019-02-23 MED ORDER — GABAPENTIN 600 MG PO TABS
300.0000 mg | ORAL_TABLET | Freq: Once | ORAL | Status: AC
  Administered 2019-02-23: 23:00:00 300 mg via ORAL
  Filled 2019-02-23: qty 0.5

## 2019-02-23 MED ORDER — FENTANYL CITRATE (PF) 100 MCG/2ML IJ SOLN
INTRAMUSCULAR | Status: AC
  Filled 2019-02-23: qty 2

## 2019-02-23 MED ORDER — BUPIVACAINE IN DEXTROSE 0.75-8.25 % IT SOLN
INTRATHECAL | Status: DC | PRN
  Administered 2019-02-23: 1.4 mL via INTRATHECAL

## 2019-02-23 MED ORDER — ACETAMINOPHEN 500 MG PO TABS: 1000.0000 mg | ORAL_TABLET | Freq: Once | ORAL | Status: DC

## 2019-02-23 MED ORDER — SODIUM CHLORIDE (PF) 0.9 % IJ SOLN
INTRAMUSCULAR | Status: AC
  Filled 2019-02-23: qty 50

## 2019-02-23 MED ORDER — FENTANYL CITRATE (PF) 100 MCG/2ML IJ SOLN
INTRAMUSCULAR | Status: DC | PRN
  Administered 2019-02-23: 15 ug via INTRATHECAL

## 2019-02-23 MED ORDER — LACTATED RINGERS IV SOLN: Freq: Once | INTRAVENOUS | Status: DC

## 2019-02-23 MED ORDER — BUPIVACAINE HCL (PF) 0.5 % IJ SOLN
INTRAMUSCULAR | Status: AC
  Filled 2019-02-23: qty 30

## 2019-02-23 MED ORDER — SODIUM CHLORIDE 0.9 % IV SOLN
INTRAVENOUS | Status: DC | PRN
  Administered 2019-02-23: 50 ug/min via INTRAVENOUS

## 2019-02-23 MED ORDER — CEFAZOLIN SODIUM-DEXTROSE 2-4 GM/100ML-% IV SOLN
2.0000 g | INTRAVENOUS | Status: AC
  Administered 2019-02-23: 2 g via INTRAVENOUS
  Filled 2019-02-23: qty 100

## 2019-02-23 SURGICAL SUPPLY — 27 items
BARRIER ADHS 3X4 INTERCEED (GAUZE/BANDAGES/DRESSINGS) ×3 IMPLANT
CANISTER SUCT 3000ML PPV (MISCELLANEOUS) ×3 IMPLANT
CHLORAPREP W/TINT 26 (MISCELLANEOUS) ×3 IMPLANT
COVER WAND RF STERILE (DRAPES) ×3 IMPLANT
DRSG TELFA 3X8 NADH (GAUZE/BANDAGES/DRESSINGS) ×3 IMPLANT
ELECT CAUTERY BLADE 6.4 (BLADE) ×3 IMPLANT
ELECT REM PT RETURN 9FT ADLT (ELECTROSURGICAL) ×3
ELECTRODE REM PT RTRN 9FT ADLT (ELECTROSURGICAL) ×1 IMPLANT
GAUZE SPONGE 4X4 12PLY STRL (GAUZE/BANDAGES/DRESSINGS) ×3 IMPLANT
GLOVE BIO SURGEON STRL SZ8 (GLOVE) ×3 IMPLANT
GOWN STRL REUS W/ TWL LRG LVL3 (GOWN DISPOSABLE) ×2 IMPLANT
GOWN STRL REUS W/ TWL XL LVL3 (GOWN DISPOSABLE) ×1 IMPLANT
GOWN STRL REUS W/TWL LRG LVL3 (GOWN DISPOSABLE) ×4
GOWN STRL REUS W/TWL XL LVL3 (GOWN DISPOSABLE) ×2
NS IRRIG 1000ML POUR BTL (IV SOLUTION) ×3 IMPLANT
PACK C SECTION AR (MISCELLANEOUS) ×3 IMPLANT
PAD OB MATERNITY 4.3X12.25 (PERSONAL CARE ITEMS) ×3 IMPLANT
PAD PREP 24X41 OB/GYN DISP (PERSONAL CARE ITEMS) ×3 IMPLANT
PENCIL SMOKE ULTRAEVAC 22 CON (MISCELLANEOUS) ×3 IMPLANT
RETRACTOR TRAXI PANNICULUS (MISCELLANEOUS) IMPLANT
STAPLER INSORB 30 2030 C-SECTI (MISCELLANEOUS) IMPLANT
STRAP SAFETY 5IN WIDE (MISCELLANEOUS) ×3 IMPLANT
SUCT VACUUM KIWI BELL (SUCTIONS) IMPLANT
SUT CHROMIC 1 CTX 36 (SUTURE) ×9 IMPLANT
SUT PLAIN GUT 0 (SUTURE) ×6 IMPLANT
SUT VIC AB 0 CT1 36 (SUTURE) ×6 IMPLANT
TRAXI PANNICULUS RETRACTOR (MISCELLANEOUS)

## 2019-02-23 NOTE — OB Triage Note (Signed)
Pt c/o vaginal discharge and flluid starting this am

## 2019-02-23 NOTE — Anesthesia Post-op Follow-up Note (Signed)
Anesthesia QCDR form completed.        

## 2019-02-23 NOTE — Discharge Summary (Signed)
Obstetrical Discharge Summary  Patient Name: Angela Terry DOB: [birth year] MRN: [id number]  Date of Admission: 02/23/2019 Date of Delivery: 02/23/2019 Delivered by: Beverly Gust Schermerhorn MD Date of Discharge:  02/25/2019 [facility]  ONG:[id number]:Patient's last menstrual period was 05/17/2018 (exact date). EDC Estimated Date of Delivery: 03/02/19 Gestational Age at Delivery: 7339w0d   Antepartum complications:  Previous cesarean Admitting Diagnosis: SROM , ctx , previous c/s  Secondary Diagnosis: Patient Active Problem List   Diagnosis Date Noted  . Post-operative state 02/24/2019  . Delivery by elective cesarean section 02/23/2019  . History of cesarean section 02/23/2019  . Abnormal glucose tolerance affecting pregnancy, antepartum 12/12/2018  . Supervision of other normal pregnancy, antepartum 12/03/2018  . Previous cesarean section 12/03/2018  . Elevated white blood cell count 12/03/2018  . Blood type, Rh positive 12/03/2018  . Illiteracy 12/03/2018    Augmentation: none Complications: None Intrapartum complications/course: patient presented with rupture of membranes and found to be in labor, thus proceeded with cesarean. Date of Delivery: 02/23/2019 at 2348 Delivered By: Beverly Gust. Schermerhorn MD Delivery Type: repeat cesarean section, low transverse incision Anesthesia: Spinal  Placenta: Manual  Laceration: n/a Episiotomy:n/a Newborn Data:female  APGARs 8/9, weight #7/10   Postpartum Procedures: none  Post partum course:  Patient had an uncomplicated postpartum course.  By time of discharge on POD#2, her pain was controlled on oral pain medications; she had appropriate lochia and was ambulating, voiding without difficulty, tolerating regular diet and passing flatus.   She was deemed stable for discharge to home.    Discharge Physical Exam:  BP 91/65 (BP Location: Right Arm)   Pulse 68   Temp 97.8 F (36.6 C) (Oral)   Resp 20   LMP 05/17/2018 (Exact Date)    SpO2 98%   Breastfeeding Unknown   General: NAD CV: RRR Pulm: CTABL, nl effort ABD: s/nd/nt, fundus firm and below the umbilicus Lochia: moderate Incision: c/d/i - slight bleed on skin surface. DVT Evaluation: LE non-ttp, no evidence of DVT on exam.  Hemoglobin  Date Value Ref Range Status  02/25/2019 10.0 (L) 12.0 - 15.0 g/dL Final  24/40/102706/29/2020 25.311.3 11.1 - 15.9 g/dL Final  66/44/034704/27/2020 42.511.6  Final   HCT  Date Value Ref Range Status  02/25/2019 29.6 (L) 36.0 - 46.0 % Final  10/08/2018 34 29 - 41 Final   Hematocrit  Date Value Ref Range Status  12/10/2018 34.4 34.0 - 46.6 % Final     Disposition: stable, discharge to home. Baby Feeding: breastmilk  Baby Disposition: home with mom  Rh Immune globulin given: n/a Rubella vaccine given: n/a  Contraception: TBD  Prenatal Labs:   ABO, Rh: O/Positive/O positive (03/03 0000) Antibody: Negative (03/03 0000) Rubella: Immune (03/03 0000), Var Imm RPR: Non Reactive (06/29 1714)  HBsAg: Negative (03/03 0000)  HIV: Non-reactive (06/29 0000)  GBS:   neg  Covid neg   Plan:  Angela Terry was discharged to home in good condition. Follow-up appointment with Dr. Feliberto GottronSchermerhorn in 2 weeks.  Discharge Medications: Allergies as of 02/25/2019   No Known Allergies     Medication List    TAKE these medications   acetaminophen 500 MG tablet Commonly known as: TYLENOL Take 2 tablets (1,000 mg total) by mouth every 6 (six) hours.   ibuprofen 800 MG tablet Commonly known as: ADVIL Take 1 tablet (800 mg total) by mouth every 6 (six) hours.   oxyCODONE 5 MG immediate release tablet Commonly known as: Oxy IR/ROXICODONE Take 1  tablet (5 mg total) by mouth every 4 (four) hours as needed for moderate pain.   Prenatal Vitamins 28-0.8 MG Tabs Take 1 tablet by mouth daily.       Follow-up Information    Schermerhorn, Gwen Her, MD Follow up in 2 week(s).   Specialty: Obstetrics and Gynecology Contact  information: [address] [place] [address] [phone]           Signed: ----- Larey Days, MD, [place] Attending Obstetrician and Gynecologist [facility], Department of [facility]

## 2019-02-23 NOTE — Progress Notes (Signed)
Patient ID: Angela Terry, female   DOB: [birth year], 28 y.o.   MRN: [id number] Pt was scheduled for c/s this Monady . She presented for SROM at 2100 . + pooling and ctx  . cx 3 cm . Proceed with repeat LTCS . internet translator present . All questions answered .

## 2019-02-23 NOTE — Anesthesia Procedure Notes (Signed)
Spinal  Patient location during procedure: OR Start time: 02/23/2019 11:34 PM End time: 02/23/2019 11:41 PM Staffing Anesthesiologist: Molli Barrows, MD Resident/CRNA: Clinton Sawyer, CRNA Performed: resident/CRNA  Preanesthetic Checklist Completed: patient identified, site marked, surgical consent, pre-op evaluation, timeout performed, IV checked, risks and benefits discussed and monitors and equipment checked Spinal Block Patient position: sitting Prep: ChloraPrep Patient monitoring: heart rate, continuous pulse ox, blood pressure and cardiac monitor Approach: midline Location: L3-4 Injection technique: single-shot Needle Needle type: Introducer and Pencil-Tip  Needle gauge: 24 G Needle length: 9 cm Assessment Sensory level: T6 Additional Notes Negative paresthesia. Negative blood return. Positive free-flowing CSF. Expiration date of kit checked and confirmed. Patient tolerated procedure well, without complications.

## 2019-02-23 NOTE — Anesthesia Preprocedure Evaluation (Addendum)
Anesthesia Evaluation  Patient identified by MRN, date of birth, ID band Patient awake    Reviewed: Allergy & Precautions, H&P , NPO status , Patient's Chart, lab work & pertinent test results, reviewed documented beta blocker date and time   Airway Mallampati: II  TM Distance: >3 FB Neck ROM: full    Dental no notable dental hx. (+) Teeth Intact   Pulmonary neg pulmonary ROS, Current Smoker,    Pulmonary exam normal breath sounds clear to auscultation       Cardiovascular Exercise Tolerance: Good negative cardio ROS   Rhythm:regular Rate:Normal     Neuro/Psych negative neurological ROS  negative psych ROS   GI/Hepatic negative GI ROS, Neg liver ROS,   Endo/Other  negative endocrine ROSdiabetes, Well Controlled, Gestational  Renal/GU      Musculoskeletal   Abdominal   Peds  Hematology negative hematology ROS (+)   Anesthesia Other Findings   Reproductive/Obstetrics (+) Pregnancy                            Anesthesia Physical Anesthesia Plan  ASA: II and emergent  Anesthesia Plan: Spinal   Post-op Pain Management:    Induction:   PONV Risk Score and Plan:   Airway Management Planned:   Additional Equipment:   Intra-op Plan:   Post-operative Plan:   Informed Consent: I have reviewed the patients History and Physical, chart, labs and discussed the procedure including the risks, benefits and alternatives for the proposed anesthesia with the patient or authorized representative who has indicated his/her understanding and acceptance.       Plan Discussed with:   Anesthesia Plan Comments:        Anesthesia Quick Evaluation

## 2019-02-23 NOTE — Progress Notes (Signed)
   Angela Terry is a 28 y.o. female. She is at [redacted]w[redacted]d gestation. Patient's last menstrual period was 05/17/2018 (exact date). Estimated Date of Delivery: 03/02/19  Prenatal care site: [facility]   Chief complaint: leakage of fluid Location: vagina Onset/timing: this morning Duration: intermittent Quality: thin water like, blood-tinged Severity: mild Aggravating or alleviating conditions: none Associated signs/symptoms: cramping Context: Angela Terry reports blood-tinged discharge this morning and then again this afternoon and evening. She reports that it was thin and like water, tinged with blood. She reports abdominal pains that started this evening and have more recently been coming every 5 minutes.   S: Resting comfortably.   She reports:  -active fetal movement -no vaginal bleeding  Maternal Medical History:   Past Medical History:  Diagnosis Date  . Abnormal Pap smear of cervix    questionable hx of 2018 abnormal PAP at [facility]  . Dental neglect    last exam years ago  . Nausea & vomiting    due to pregnancy    Past Surgical History:  Procedure Laterality Date  . CESAREAN SECTION  02/2010   [place]    No Known Allergies  Prior to Admission medications   Medication Sig Start Date End Date Taking? Authorizing Provider  Prenatal Vit-Fe Fumarate-FA (PRENATAL VITAMINS) 28-0.8 MG TABS Take 1 tablet by mouth daily. 02/11/19 05/22/19  Sciora, Real Cons, CNM     Social History: She  reports that she has never smoked. She has never used smokeless tobacco. She reports previous alcohol use. She reports that she does not use drugs.  Family History: family history is not on file.   Review of Systems: A full review of systems was performed and negative except as noted in the HPI.     O:  BP 117/70 (BP Location: Left Arm)   Pulse 85   Temp 99.3 F (37.4 C) (Oral)   Resp 16   LMP 05/17/2018 (Exact Date)  No results found for this or any  previous visit (from the past 65 hour(s)).   Constitutional: NAD, AAOx3  HE/ENT: extraocular movements grossly intact, moist mucous membranes PULM: normal respiratory effort Ext: Non-tender, Nonedmeatous   Psych: mood appropriate, speech normal Pelvic: SSE: positive pooling, positive ferning  SVE: 3cm/60-70%/-2 mid-position/medium  NST:  Baseline: 150bpm Variability: moderate Accelerations: 15x15 present x >2 Decelerations: variable Time: 31mins  A/P: 28 y.o. [redacted]w[redacted]d here for antenatal surveillance during pregnancy.  Principle diagnosis: PROM, history of cesarean section  Fetal Wellbeing  Category II for variable decels, overall reassuring  PROM  Confirmed with SSE: +pooling, +ferning  History of prior cesarean section  Desires repeat  Originally scheduled for 02/25/2019  Discussed with Dr. Ouida Sills, who advises that he will proceed with delivery by cesarean section tonight. Ordered to give dose of terbutaline to calm uterine activity.    Lisette Grinder 02/23/2019 10:40 PM  ----- Lisette Grinder, CNM Certified Nurse Midwife [facility], Department of [facility]

## 2019-02-24 ENCOUNTER — Encounter: Payer: Self-pay | Admitting: Anesthesiology

## 2019-02-24 DIAGNOSIS — Z9889 Other specified postprocedural states: Secondary | ICD-10-CM

## 2019-02-24 LAB — CBC
HCT: 34.7 % — ABNORMAL LOW (ref 36.0–46.0)
Hemoglobin: 11.9 g/dL — ABNORMAL LOW (ref 12.0–15.0)
MCH: 30.4 pg (ref 26.0–34.0)
MCHC: 34.3 g/dL (ref 30.0–36.0)
MCV: 88.5 fL (ref 80.0–100.0)
Platelets: 303 10*3/uL (ref 150–400)
RBC: 3.92 MIL/uL (ref 3.87–5.11)
RDW: 13.3 % (ref 11.5–15.5)
WBC: 22.6 10*3/uL — ABNORMAL HIGH (ref 4.0–10.5)
nRBC: 0 % (ref 0.0–0.2)

## 2019-02-24 LAB — RPR: RPR Ser Ql: NONREACTIVE

## 2019-02-24 MED ORDER — SIMETHICONE 80 MG PO CHEW
80.0000 mg | CHEWABLE_TABLET | ORAL | Status: DC
  Administered 2019-02-24: 23:00:00 80 mg via ORAL
  Filled 2019-02-24: qty 1

## 2019-02-24 MED ORDER — OXYCODONE HCL 5 MG PO TABS
5.0000 mg | ORAL_TABLET | ORAL | Status: DC | PRN
  Administered 2019-02-25: 5 mg via ORAL
  Filled 2019-02-24: qty 1

## 2019-02-24 MED ORDER — MENTHOL 3 MG MT LOZG
1.0000 | LOZENGE | OROMUCOSAL | Status: DC | PRN
  Filled 2019-02-24: qty 9

## 2019-02-24 MED ORDER — BUPIVACAINE LIPOSOME 1.3 % IJ SUSP
INTRAMUSCULAR | Status: DC | PRN
  Administered 2019-02-24: 50 mL

## 2019-02-24 MED ORDER — ACETAMINOPHEN 500 MG PO TABS
1000.0000 mg | ORAL_TABLET | Freq: Four times a day (QID) | ORAL | Status: AC
  Administered 2019-02-24: 12:00:00 1000 mg via ORAL
  Filled 2019-02-24 (×3): qty 2

## 2019-02-24 MED ORDER — IBUPROFEN 800 MG PO TABS
800.0000 mg | ORAL_TABLET | Freq: Three times a day (TID) | ORAL | Status: DC
  Administered 2019-02-25: 800 mg via ORAL
  Filled 2019-02-24: qty 1

## 2019-02-24 MED ORDER — OXYTOCIN 40 UNITS IN NORMAL SALINE INFUSION - SIMPLE MED
2.5000 [IU]/h | INTRAVENOUS | Status: AC
  Administered 2019-02-24: 2.5 [IU]/h via INTRAVENOUS
  Filled 2019-02-24: qty 1000

## 2019-02-24 MED ORDER — NALBUPHINE HCL 10 MG/ML IJ SOLN: 5.0000 mg | Freq: Once | INTRAMUSCULAR | Status: DC | PRN

## 2019-02-24 MED ORDER — SODIUM CHLORIDE 0.9% FLUSH: 3.0000 mL | INTRAVENOUS | Status: DC | PRN

## 2019-02-24 MED ORDER — KETOROLAC TROMETHAMINE 30 MG/ML IJ SOLN
INTRAMUSCULAR | Status: DC | PRN
  Administered 2019-02-24: 30 mg via INTRAVENOUS

## 2019-02-24 MED ORDER — SENNOSIDES-DOCUSATE SODIUM 8.6-50 MG PO TABS
2.0000 | ORAL_TABLET | ORAL | Status: DC
  Administered 2019-02-24: 23:00:00 2 via ORAL
  Filled 2019-02-24: qty 2

## 2019-02-24 MED ORDER — ONDANSETRON HCL 4 MG/2ML IJ SOLN: 4.0000 mg | Freq: Once | INTRAMUSCULAR | Status: DC | PRN

## 2019-02-24 MED ORDER — SODIUM CHLORIDE (PF) 0.9 % IJ SOLN
INTRAMUSCULAR | Status: DC | PRN
  Administered 2019-02-24: 50 mL via INTRAVENOUS

## 2019-02-24 MED ORDER — KETOROLAC TROMETHAMINE 30 MG/ML IJ SOLN: 30.0000 mg | Freq: Four times a day (QID) | INTRAMUSCULAR | Status: AC | PRN

## 2019-02-24 MED ORDER — TETANUS-DIPHTH-ACELL PERTUSSIS 5-2.5-18.5 LF-MCG/0.5 IM SUSP: 0.5000 mL | Freq: Once | INTRAMUSCULAR | Status: DC

## 2019-02-24 MED ORDER — ZOLPIDEM TARTRATE 5 MG PO TABS: 5.0000 mg | ORAL_TABLET | Freq: Every evening | ORAL | Status: DC | PRN

## 2019-02-24 MED ORDER — NALOXONE HCL 4 MG/10ML IJ SOLN
1.0000 ug/kg/h | INTRAVENOUS | Status: DC | PRN
  Filled 2019-02-24: qty 5

## 2019-02-24 MED ORDER — NALBUPHINE HCL 10 MG/ML IJ SOLN: 5.0000 mg | INTRAMUSCULAR | Status: DC | PRN

## 2019-02-24 MED ORDER — COCONUT OIL OIL: 1.0000 "application " | TOPICAL_OIL | Status: DC | PRN

## 2019-02-24 MED ORDER — KETOROLAC TROMETHAMINE 30 MG/ML IJ SOLN
INTRAMUSCULAR | Status: AC
  Filled 2019-02-24: qty 1

## 2019-02-24 MED ORDER — DIPHENHYDRAMINE HCL 50 MG/ML IJ SOLN: 12.5000 mg | INTRAMUSCULAR | Status: DC | PRN

## 2019-02-24 MED ORDER — FENTANYL CITRATE (PF) 100 MCG/2ML IJ SOLN: 25.0000 ug | INTRAMUSCULAR | Status: DC | PRN

## 2019-02-24 MED ORDER — METHYLERGONOVINE MALEATE 0.2 MG/ML IJ SOLN
INTRAMUSCULAR | Status: DC | PRN
  Administered 2019-02-24: 0.2 mg via INTRAMUSCULAR

## 2019-02-24 MED ORDER — PRENATAL MULTIVITAMIN CH
1.0000 | ORAL_TABLET | Freq: Every day | ORAL | Status: DC
  Administered 2019-02-24 – 2019-02-25 (×2): 1 via ORAL
  Filled 2019-02-24 (×2): qty 1

## 2019-02-24 MED ORDER — DIBUCAINE (PERIANAL) 1 % EX OINT: 1.0000 "application " | TOPICAL_OINTMENT | CUTANEOUS | Status: DC | PRN

## 2019-02-24 MED ORDER — DIPHENHYDRAMINE HCL 25 MG PO CAPS: 25.0000 mg | ORAL_CAPSULE | Freq: Four times a day (QID) | ORAL | Status: DC | PRN

## 2019-02-24 MED ORDER — ACETAMINOPHEN 500 MG PO TABS
1000.0000 mg | ORAL_TABLET | Freq: Four times a day (QID) | ORAL | Status: DC
  Administered 2019-02-24 – 2019-02-25 (×4): 1000 mg via ORAL
  Filled 2019-02-24 (×4): qty 2

## 2019-02-24 MED ORDER — METHYLERGONOVINE MALEATE 0.2 MG/ML IJ SOLN
INTRAMUSCULAR | Status: AC
  Filled 2019-02-24: qty 1

## 2019-02-24 MED ORDER — ONDANSETRON HCL 4 MG/2ML IJ SOLN
INTRAMUSCULAR | Status: DC | PRN
  Administered 2019-02-24: 4 mg via INTRAVENOUS

## 2019-02-24 MED ORDER — NALOXONE HCL 0.4 MG/ML IJ SOLN: 0.4000 mg | INTRAMUSCULAR | Status: DC | PRN

## 2019-02-24 MED ORDER — GABAPENTIN 300 MG PO CAPS
300.0000 mg | ORAL_CAPSULE | Freq: Every day | ORAL | Status: DC
  Administered 2019-02-24: 300 mg via ORAL
  Filled 2019-02-24 (×2): qty 1

## 2019-02-24 MED ORDER — DEXAMETHASONE SODIUM PHOSPHATE 10 MG/ML IJ SOLN
INTRAMUSCULAR | Status: DC | PRN
  Administered 2019-02-24: 10 mg via INTRAVENOUS

## 2019-02-24 MED ORDER — ENOXAPARIN SODIUM 40 MG/0.4ML ~~LOC~~ SOLN
40.0000 mg | SUBCUTANEOUS | Status: DC
  Filled 2019-02-24: qty 0.4

## 2019-02-24 MED ORDER — WITCH HAZEL-GLYCERIN EX PADS: 1.0000 "application " | MEDICATED_PAD | CUTANEOUS | Status: DC | PRN

## 2019-02-24 MED ORDER — SIMETHICONE 80 MG PO CHEW: 80.0000 mg | CHEWABLE_TABLET | ORAL | Status: DC | PRN

## 2019-02-24 MED ORDER — DIPHENHYDRAMINE HCL 25 MG PO CAPS: 25.0000 mg | ORAL_CAPSULE | ORAL | Status: DC | PRN

## 2019-02-24 MED ORDER — LACTATED RINGERS IV SOLN: INTRAVENOUS | Status: DC

## 2019-02-24 MED ORDER — ONDANSETRON HCL 4 MG/2ML IJ SOLN: 4.0000 mg | Freq: Three times a day (TID) | INTRAMUSCULAR | Status: DC | PRN

## 2019-02-24 MED ORDER — SIMETHICONE 80 MG PO CHEW
80.0000 mg | CHEWABLE_TABLET | Freq: Three times a day (TID) | ORAL | Status: DC
  Administered 2019-02-24 – 2019-02-25 (×4): 80 mg via ORAL
  Filled 2019-02-24 (×5): qty 1

## 2019-02-24 NOTE — Brief Op Note (Signed)
02/23/2019  12:23 AM  PATIENT:  Angela Terry  28 y.o. female  PRE-OPERATIVE DIAGNOSIS:  previous cesarean, SROM , CTX   POST-OPERATIVE DIAGNOSIS: Same as above PROCEDURE:  Procedure(s): CESAREAN SECTION (N/A) LTCS SURGEON:  Surgeon(s) and Role:    * Schermerhorn, Gwen Her, MD - Primary  PHYSICIAN ASSISTANT: [place] , CNM   ASSISTANTS: none   ANESTHESIA:   spinal  EBL:  600 mL   BLOOD ADMINISTERED:none  DRAINS: Urinary Catheter (Foley)   LOCAL MEDICATIONS USED:  MARCAINE    and BUPIVICAINE   SPECIMEN:  No Specimen  DISPOSITION OF SPECIMEN:  N/A  COUNTS:  YES  TOURNIQUET:  * No tourniquets in log *  DICTATION: .Other Dictation: Dictation Number verbal  PLAN OF CARE: Admit to inpatient   PATIENT DISPOSITION:  PACU - hemodynamically stable.   Delay start of Pharmacological VTE agent (>24hrs) due to surgical blood loss or risk of bleeding: not applicable

## 2019-02-24 NOTE — Transfer of Care (Signed)
Immediate Anesthesia Transfer of Care Note  Patient: Angela Terry  Procedure(s) Performed: CESAREAN SECTION (N/A )  Patient Location: labor & delivery   Anesthesia Type:Spinal  Level of Consciousness: awake, alert  and oriented  Airway & Oxygen Therapy: Patient Spontanous Breathing  Post-op Assessment: Report given to RN and Post -op Vital signs reviewed and stable  Post vital signs: Reviewed and stable  Last Vitals:  Vitals Value Taken Time  BP 113/67 02/24/19 0041  Temp    Pulse 84 02/24/19 0043  Resp 10 02/24/19 0042  SpO2 100 % 02/24/19 0043  Vitals shown include unvalidated device data.  Last Pain:  Vitals:   02/23/19 2133  TempSrc: Oral         Complications: No apparent anesthesia complications

## 2019-02-24 NOTE — Progress Notes (Signed)
Post Op Day 1  Subjective: Doing well, no concerns. Ambulating without difficulty, pain managed with PO meds, tolerating regular diet, and voiding without difficulty.   No fever/chills, chest pain, shortness of breath, nausea/vomiting, or leg pain. No nipple or breast pain.   Objective: BP 101/63 (BP Location: Left Arm)   Pulse 71   Temp 99 F (37.2 C) (Oral)   Resp 18   LMP 05/17/2018 (Exact Date)   SpO2 97%   Breastfeeding Unknown    Physical Exam:  General: alert, cooperative, appears stated age and no distress Breasts: soft/nontender CV: RRR Pulm: nl effort, CTABL Abdomen: soft, non-tender, active bowel sounds Uterine Fundus: firm Incision: no significant drainage Lochia: appropriate DVT Evaluation: No evidence of DVT seen on physical exam. No cords or calf tenderness. No significant calf/ankle edema.  Recent Labs    02/23/19 2250 02/24/19 0653  HGB 12.3 11.9*  HCT 36.0 34.7*  WBC 13.2* 22.6*  PLT 326 303    Assessment/Plan: 28 y.o. G2P2002 postop day # 1  -Continue routine postpartum care -Formula feeding.  -Leukocytosis: no fundal tenderness, no other obvious signs of infection, plan to repeat CBC tomorrow morning -Immunization status: all immunizations up to date  Disposition: Continue inpatient postpartum care    LOS: 1 day   Lisette Grinder, CNM 02/24/2019, 9:42 AM   ----- Lisette Grinder Certified Nurse Midwife [facility]

## 2019-02-25 ENCOUNTER — Encounter: Admission: RE | Payer: Self-pay | Source: Home / Self Care

## 2019-02-25 ENCOUNTER — Inpatient Hospital Stay: Admission: RE | Admit: 2019-02-25 | Payer: Self-pay | Source: Home / Self Care | Admitting: Obstetrics and Gynecology

## 2019-02-25 LAB — CBC
HCT: 29.6 % — ABNORMAL LOW (ref 36.0–46.0)
Hemoglobin: 10 g/dL — ABNORMAL LOW (ref 12.0–15.0)
MCH: 30.2 pg (ref 26.0–34.0)
MCHC: 33.8 g/dL (ref 30.0–36.0)
MCV: 89.4 fL (ref 80.0–100.0)
Platelets: 306 10*3/uL (ref 150–400)
RBC: 3.31 MIL/uL — ABNORMAL LOW (ref 3.87–5.11)
RDW: 13.6 % (ref 11.5–15.5)
WBC: 18 10*3/uL — ABNORMAL HIGH (ref 4.0–10.5)
nRBC: 0 % (ref 0.0–0.2)

## 2019-02-25 SURGERY — Surgical Case
Anesthesia: Spinal

## 2019-02-25 MED ORDER — OXYCODONE HCL 5 MG PO TABS: 5.0000 mg | ORAL_TABLET | ORAL | 0 refills | Status: DC | PRN

## 2019-02-25 MED ORDER — IBUPROFEN 800 MG PO TABS: 800.0000 mg | ORAL_TABLET | Freq: Four times a day (QID) | ORAL | 1 refills | Status: DC

## 2019-02-25 MED ORDER — ACETAMINOPHEN 500 MG PO TABS: 1000.0000 mg | ORAL_TABLET | Freq: Four times a day (QID) | ORAL | 0 refills | Status: DC

## 2019-02-25 NOTE — Progress Notes (Signed)
B/P is 92/64. Pt. Is alert and smiling;denied c/o.

## 2019-02-25 NOTE — Progress Notes (Signed)
Dc inst reviewed with pt thru the interpreter line.  Pt verb u/o of care at home and f/u appointments

## 2019-02-25 NOTE — Discharge Instructions (Signed)
Discharge instructions:  ° °Call office if you have any of the following: headache, visual changes, fever >101.0 F, chills, shortness of breath, breast concerns, excessive vaginal bleeding, incision drainage or problems, leg pain or redness, depression or any other concerns.  ° °Activity: Do not lift > 10 lbs for 6 weeks.  °No intercourse or tampons for 6 weeks.  °No driving for 1-2 weeks.  ° °Call your doctor for increased pain or vaginal bleeding, temperature above 101.0, depression, or concerns.  No strenuous activity or heavy lifting for 6 weeks.  No intercourse, tampons, douching, or enemas for 6 weeks.  No tub baths-showers only.  No driving for 2 weeks or while taking pain medications.  Continue prenatal vitamin and iron.  Increase calories and fluids while breastfeeding. ° °You may have a slight fever when your milk comes in, but it should go away on its own.  If it does not, and rises above 101.0 please call the doctor. ° °For concerns about your baby, please call your pediatrician °For breastfeeding concerns, the lactation consultant can be reached at [phone] ° ° °

## 2019-02-25 NOTE — Progress Notes (Addendum)
Pt. B/P reported as 88/51 and 84/75. n Went to room to assess Pt. Pt. Is standing at bedside, holding Infant and smiling. Video Interpreter accessed and Interpreter [place] # [id number] assisted in interpretation. Pt. Denies c/o and states she "Just feel sleepy." Pt. Had received Roxicodone at 0249. Recheck of B/P is 95/62. Pt. Appears comfortable and in NAD and Denies c/o.Pt. instructed to hold Infant while sitting down and to call RN for assistance in placing Infant back in basinet.Pt. v/o and denies c/o.

## 2019-02-25 NOTE — Progress Notes (Signed)
DC home with NB.  To car via wc escorted by staff

## 2019-02-26 NOTE — Anesthesia Postprocedure Evaluation (Signed)
Anesthesia Post Note  Patient: Angela Terry  Procedure(s) Performed: CESAREAN SECTION (N/A )  Patient location during evaluation: PACU Anesthesia Type: Spinal Level of consciousness: awake and alert Pain management: pain level controlled Vital Signs Assessment: post-procedure vital signs reviewed and stable Respiratory status: spontaneous breathing, nonlabored ventilation, respiratory function stable and patient connected to nasal cannula oxygen Cardiovascular status: blood pressure returned to baseline and stable Postop Assessment: no apparent nausea or vomiting Anesthetic complications: no     Last Vitals:  Vitals:   02/25/19 0450 02/25/19 0737  BP: 92/64 91/65  Pulse:  68  Resp:  20  Temp:  36.6 C  SpO2:  98%    Last Pain:  Vitals:   02/25/19 0845  TempSrc:   PainSc: 0-No pain                 Molli Barrows

## 2019-02-28 NOTE — Addendum Note (Signed)
Addended by: Cletis Media on: 02/28/2019 09:20 AM   Modules accepted: Orders

## 2019-02-28 NOTE — Addendum Note (Signed)
Addended by: Cletis Media on: 02/28/2019 09:19 AM   Modules accepted: Orders

## 2019-03-01 NOTE — Op Note (Signed)
NAME: Angela Terry, Angela Terry MEDICAL RECORD [id number] ACCOUNT [id number] DATE OF BIRTH:11-19-1990 FACILITY: [facility] LOCATION: [facility] PHYSICIAN:THOMAS Josefine Class, MD  OPERATIVE REPORT  DATE OF PROCEDURE:  02/23/2019  PREOPERATIVE DIAGNOSES: 1.  Estimated gestational age 27+0 weeks. 2.  Spontaneous rupture of membranes with active labor. 3.  Previous cesarean section.  POSTOPERATIVE DIAGNOSES: 1.  Estimated gestational age 28+0 weeks. 2.  Spontaneous rupture of membranes with active labor. 3.  Previous cesarean section. 4.  Meconium-stained amniotic fluid.  PROCEDURE:  Elective repeat low transverse cesarean section.  ANESTHESIA:  Spinal.  SURGEON:  Laverta Baltimore, MD  FIRST ASSISTANT:  Lisette Grinder, certified nurse midwife  INDICATIONS:  A 28 year old gravida 2, para 1 patient was scheduled for a cesarean section on 02/25/2019 when she presented to labor and delivery with spontaneous rupture of membranes and active contractions.  DESCRIPTION OF PROCEDURE:  After adequate spinal anesthesia, the patient was placed in dorsal supine position, hip roll under the right side.  The patient's abdomen was prepped and draped in normal sterile fashion.  The patient did receive 2 g IV Ancef  prior to commencement of the case.  Timeout was performed.  A Pfannenstiel incision was made 2 fingerbreadths above symphysis pubis.  Sharp dissection was used to identify the fascia.  Fascia was opened in the midline and opened in a transverse fashion.   The superior aspect of the fascia was grasped with Kocher clamps, and the recti muscles were dissected free.  The inferior aspect of the fascia was grasped with Kocher clamps.  Pyramidalis muscle was dissected free.  Entry into the peritoneal cavity was  accomplished sharply.  The vesicouterine peritoneal fold was identified and opened, and the bladder was reflected inferiorly.  A low transverse uterine incision was made.  Upon  entry into the endometrial cavity, meconium-stained fluid was noted.  Fetal  head was then brought to the incision, and a Kiwi vacuum was applied to the fetus occiput.  With one gentle pull, the head was delivered and the vacuum was removed.  A double nuchal cord was noted and reduced.  Shoulders and body were delivered without  difficulty.  A vigorous female was then placed on the patient's abdomen and dried and suctioned for 60 seconds, and the cord was then clamped.    Baby was passed to the nursery staff who assigned Apgar scores of 8 and 9, weight 7 pounds 10 ounces.   Delivery time 2348 on 02/23/2019.  Placenta was manually delivered, and the uterus was then exteriorized.  The endometrial cavity was wiped clean with laparotomy tape.  Uterine incision was closed with 1 chromic suture in a running locking fashion.  Good  approximation of the edges.  Good hemostasis was noted.  Posterior cul-de-sac was irrigated and suctioned, and the uterus was placed back in the abdominal cavity.  The patient did have some uterine atony, and she required 1 dose of 0.2 mg intramuscular  Methergine.  Pericolic gutters were wiped clean with laparotomy tape.  Uterine incision appeared hemostatic, and Interceed was placed over the uterine incision in a T-shaped fashion.  The fascia was then closed with 0 Vicryl suture.  Subfascial tissues  were injected with a solution of 20 mL of 1.3% Exparel with 30 mL of 0.5% Marcaine and 50 mL of normal saline.  60 mL was injected subfascially.  Subcutaneous tissues were irrigated and bovied for hemostasis, and the skin was reapproximated with Insorb  absorbable staples.  Good cosmetic effect.  Additional 30 mL  of Exparel solution was injected beneath the skin.  A sterile dressing was applied.  ESTIMATED BLOOD LOSS:  600 mL.  INTRAOPERATIVE FLUIDS:  800 mL.  URINE OUTPUT:  100 mL.  DISPOSITION:  The patient tolerated the procedure well and was taken to recovery room in good  condition.  LN/NUANCE  D:02/24/2019 T:02/24/2019 JOB:[id number]

## 2019-04-15 ENCOUNTER — Encounter: Payer: Self-pay | Admitting: Advanced Practice Midwife

## 2019-04-15 ENCOUNTER — Ambulatory Visit: Payer: Self-pay | Admitting: Advanced Practice Midwife

## 2019-04-15 ENCOUNTER — Other Ambulatory Visit: Payer: Self-pay

## 2019-04-15 ENCOUNTER — Ambulatory Visit (LOCAL_COMMUNITY_HEALTH_CENTER): Payer: Self-pay

## 2019-04-15 VITALS — BP 113/67 | Ht 62.0 in | Wt 111.6 lb

## 2019-04-15 DIAGNOSIS — Z3009 Encounter for other general counseling and advice on contraception: Secondary | ICD-10-CM

## 2019-04-15 DIAGNOSIS — Z30013 Encounter for initial prescription of injectable contraceptive: Secondary | ICD-10-CM

## 2019-04-15 DIAGNOSIS — Z23 Encounter for immunization: Secondary | ICD-10-CM

## 2019-04-15 LAB — HEMOGLOBIN, FINGERSTICK: Hemoglobin: 13.7 g/dL (ref 11.1–15.9)

## 2019-04-15 MED ORDER — MEDROXYPROGESTERONE ACETATE 150 MG/ML IM SUSP
150.0000 mg | INTRAMUSCULAR | Status: AC
  Administered 2019-04-15 – 2020-03-23 (×4): 150 mg via INTRAMUSCULAR

## 2019-04-15 NOTE — Progress Notes (Signed)
Flu vaccine given while here for FP appt. Tolerated well. Hal Morales, RN

## 2019-04-15 NOTE — Progress Notes (Addendum)
Here today for PP exam. Wants Depo and Flu vaccine today. Hal Morales, RN Depo given and tolerated well. Hal Morales, RN

## 2019-04-15 NOTE — Progress Notes (Signed)
Post Partum Exam  Angela Terry is a 28 y.o. G60P2002 nonsmoking female who presents for a postpartum visit. She is 7 weeks postpartum following a low cervical transverse Cesarean section after SROM with meconium for 7#10 M with EBL: 600 cc.  Prior c/s. I have fully reviewed the prenatal and intrapartum course. The delivery was at 39.0 gestational weeks.  Anesthesia: spinal. Postpartum course has been wnl. Baby's course has been wnl. Baby is feeding by bottle - formula. Bleeding red. Bowel function is normal. Bladder function is normal. Patient is not sexually active. Contraception method is Depo-Provera injections. LMP 04/13/19.  Husband helps with kids.  Nonsmoker, denies ETOH. Denies pp coitus.  Last pap 06/20/2016 wnl  Postpartum depression screening: Edinburgh Postnatal Depression Scale - 04/15/19 1118      Edinburgh Postnatal Depression Scale:  In the Past 7 Days   I have been able to laugh and see the funny side of things.  0    I have looked forward with enjoyment to things.  0    I have blamed myself unnecessarily when things went wrong.  0    I have been anxious or worried for no good reason.  0    I have felt scared or panicky for no good reason.  0    I have been so unhappy that I have had difficulty sleeping.  0    I have felt sad or miserable.  0    I have been so unhappy that I have been crying.  0    The thought of harming myself has occurred to me.  0        Last pap smear done 06/20/2016 @ [facility] and was Normal  Review of Systems Pertinent items are noted in HPI.    Objective:  BP 113/67   Ht 5\' 2"  (1.575 m)   Wt 111 lb 9.6 oz (50.6 kg)   LMP 04/13/2019 (Exact Date)   Breastfeeding No   BMI 20.41 kg/m   General:  alert   Breasts:  negative  Lungs: clear to auscultation bilaterally  Heart:  regular rate and rhythm, S1, S2 normal, no murmur, click, rub or gallop  Abdomen: soft, non-tender; bowel sounds normal; no masses,  no organomegaly   Vulva:   not evaluated  Vagina: not evaluated  Cervix:  on menses so exam not done  Corpus: not examined  Adnexa:  not evaluated  Rectal Exam: Not performed.        Assessment:    7 wk postpartum exam. Pap smear UTD  Plan:   1. Contraception: Depo-Provera injections--may have DMPA 150 mg IM q 11-13 wks x 1 year.  Please counsel on need for backup condoms/abstinance next 7 days 2. Infant feeding:  patient is currently feeding with formula.  If breastmilk feeding patient was given letter for employer to provide appropriate pumping time to express breastmilk.  3. Mood: EPDS is low risk. Reviewed resources and that mood sx in first year after pregnancy are considered related to pregnancy and to reach out for help at [facility] if needed. Discussed [facility] as link to care and availability of LCSW for counseling  4. Chronic Medical Conditions:   list chronic medical problems and follow up/management plan.   Patient given handout about PCP care in the community Given MVI per family planning program  Follow up in: 1 year . or as needed.

## 2019-07-09 ENCOUNTER — Ambulatory Visit (LOCAL_COMMUNITY_HEALTH_CENTER): Payer: Self-pay

## 2019-07-09 ENCOUNTER — Other Ambulatory Visit: Payer: Self-pay

## 2019-07-09 VITALS — BP 103/66 | Ht 62.0 in | Wt 119.5 lb

## 2019-07-09 DIAGNOSIS — Z30013 Encounter for initial prescription of injectable contraceptive: Secondary | ICD-10-CM

## 2019-07-09 DIAGNOSIS — Z3009 Encounter for other general counseling and advice on contraception: Secondary | ICD-10-CM

## 2019-07-09 DIAGNOSIS — Z3042 Encounter for surveillance of injectable contraceptive: Secondary | ICD-10-CM

## 2019-07-09 NOTE — Progress Notes (Signed)
Pt to clinic for depo, no problems. 12.1 weeks post depo today. DMPA 150 mg IM administered per Arnetha Courser, CNM order dated 04/15/2019 (1 year depo order at [facility]).

## 2019-09-27 ENCOUNTER — Ambulatory Visit (LOCAL_COMMUNITY_HEALTH_CENTER): Payer: Self-pay

## 2019-09-27 ENCOUNTER — Other Ambulatory Visit: Payer: Self-pay

## 2019-09-27 VITALS — BP 110/72 | Ht 62.0 in | Wt 121.0 lb

## 2019-09-27 DIAGNOSIS — Z3009 Encounter for other general counseling and advice on contraception: Secondary | ICD-10-CM

## 2019-09-27 DIAGNOSIS — Z3042 Encounter for surveillance of injectable contraceptive: Secondary | ICD-10-CM

## 2019-09-27 MED ORDER — MEDROXYPROGESTERONE ACETATE 150 MG/ML IM SUSP
150.0000 mg | Freq: Once | INTRAMUSCULAR | Status: AC
  Administered 2019-09-27: 150 mg via INTRAMUSCULAR

## 2019-09-27 MED ORDER — MULTIVITAMINS PO CAPS: 1.0000 | ORAL_CAPSULE | Freq: Every day | ORAL | 0 refills | Status: DC

## 2019-09-27 NOTE — Progress Notes (Signed)
11.3 weeks post depo today. DMPA 150 mg IM administered today per Arnetha Courser, CNM order dated 04/15/2019 (depo order for 1 year).

## 2019-12-23 ENCOUNTER — Ambulatory Visit: Payer: Self-pay

## 2019-12-25 ENCOUNTER — Ambulatory Visit (LOCAL_COMMUNITY_HEALTH_CENTER): Payer: Self-pay

## 2019-12-25 ENCOUNTER — Other Ambulatory Visit: Payer: Self-pay

## 2019-12-25 VITALS — BP 96/63 | Ht 62.0 in | Wt 118.0 lb

## 2019-12-25 DIAGNOSIS — Z3042 Encounter for surveillance of injectable contraceptive: Secondary | ICD-10-CM

## 2019-12-25 DIAGNOSIS — Z30013 Encounter for initial prescription of injectable contraceptive: Secondary | ICD-10-CM

## 2019-12-25 DIAGNOSIS — Z3009 Encounter for other general counseling and advice on contraception: Secondary | ICD-10-CM

## 2019-12-25 NOTE — Progress Notes (Signed)
Pt is 12.5 weeks post depo today. DMPA 150 mg IM administered today per Arnetha Courser, CNM order dated 04/15/2019 (1 year depo).

## 2019-12-29 IMAGING — US OBSTETRIC <14 WK US AND TRANSVAGINAL OB US
1 series · 14 of 28 positions shown · non-contrast
Comparison: None available.

CLINICAL DATA: Initial evaluation for suspected early pregnancy,
first trimester.

EXAM:
OBSTETRIC <14 WK ULTRASOUND
TECHNIQUE: Transabdominal ultrasound was performed for evaluation of the
gestation as well as the maternal uterus and adnexal regions.

[Series 1: obstetric <14 wk us and transvaginal ob us · 14 of 45 slices shown]
[im 2/45]
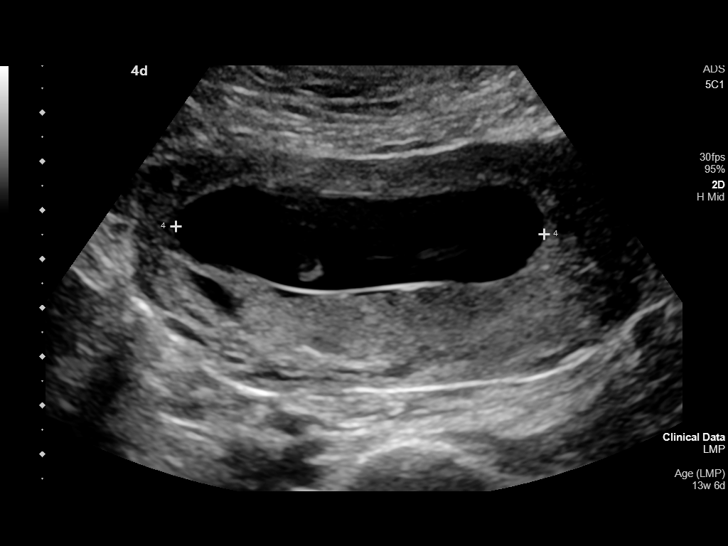
[im 5/45]
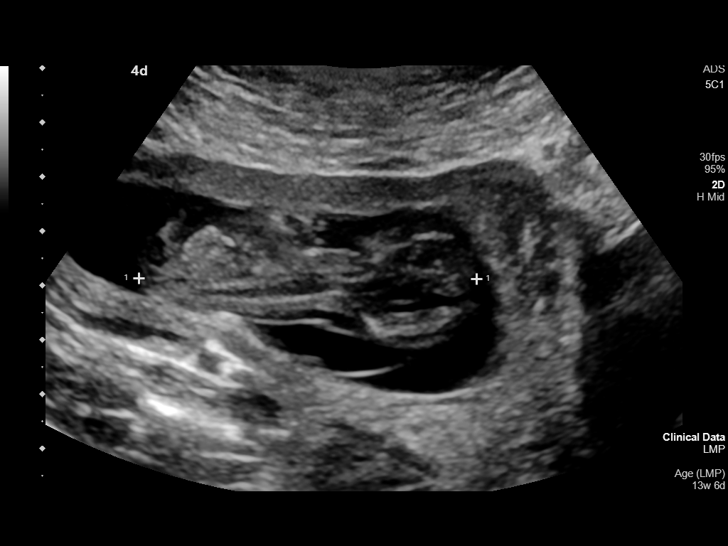
[im 9/45]
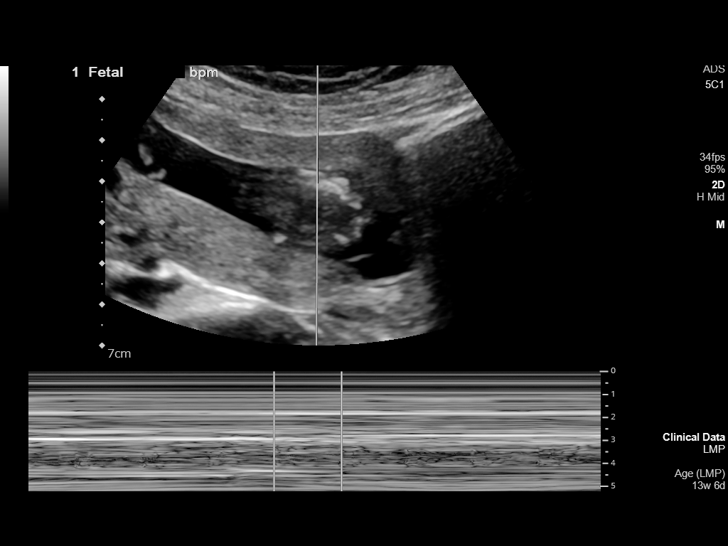
[im 12/45]
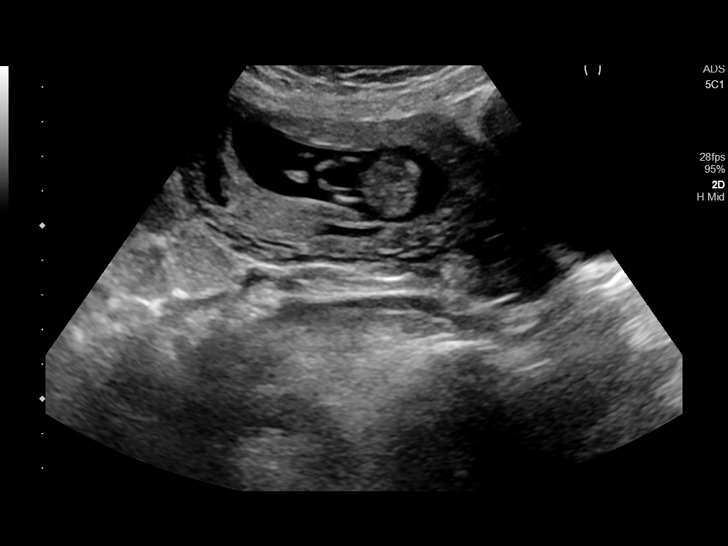
[im 15/45]
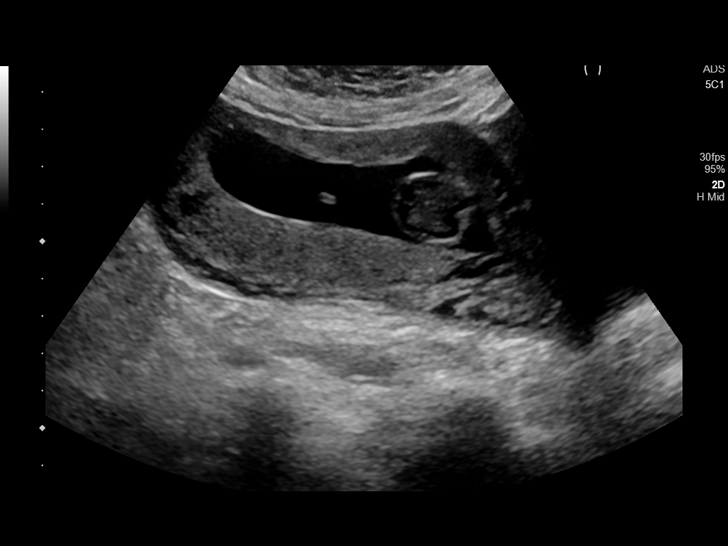
[im 18/45]
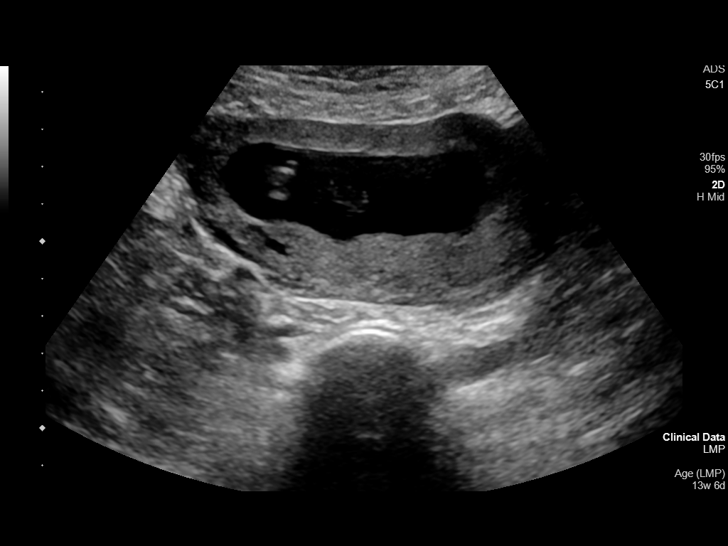
[im 22/45]
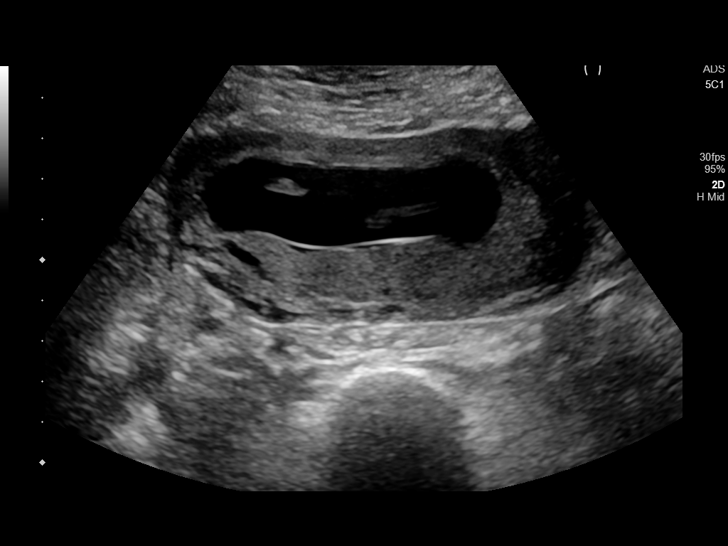
[im 25/45]
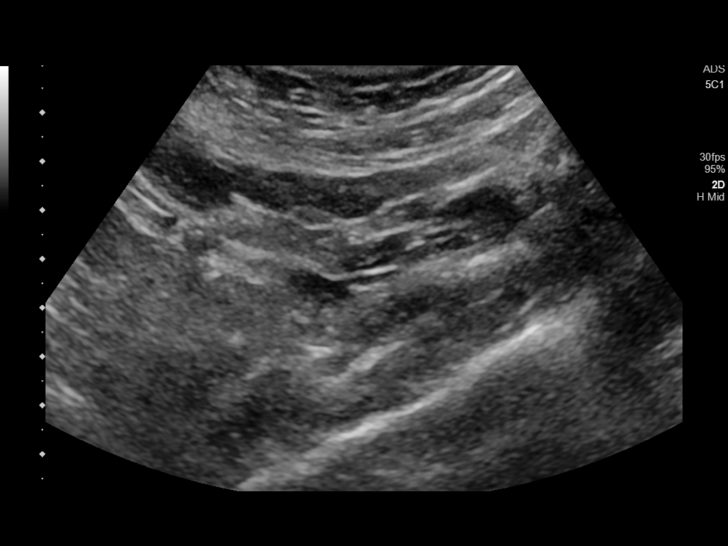
[im 28/45]
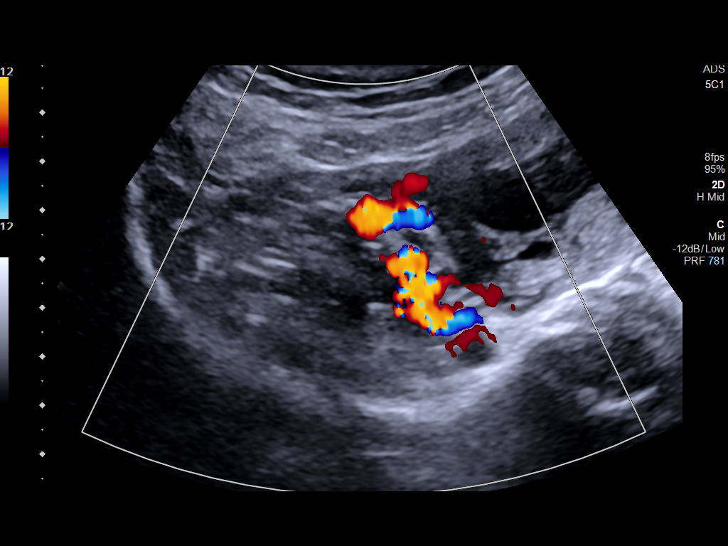
[im 31/45]
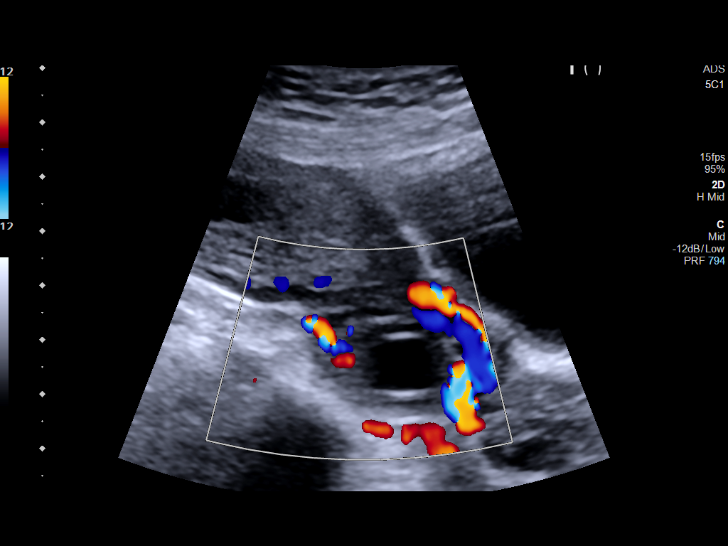
[im 35/45]
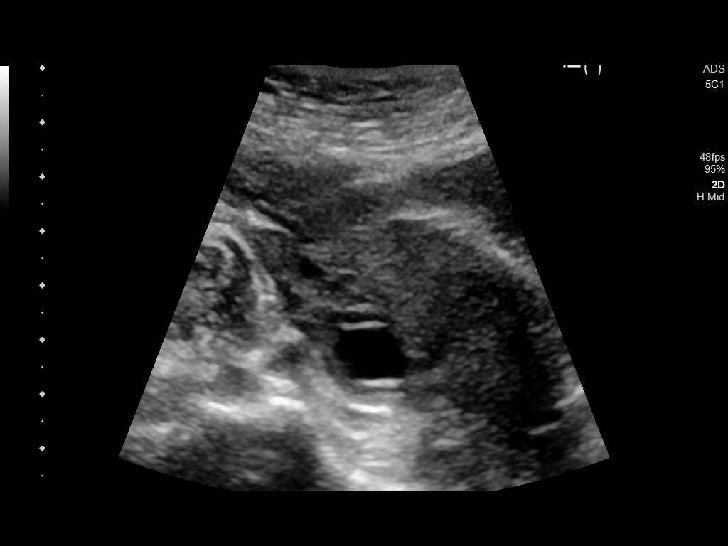
[im 38/45]
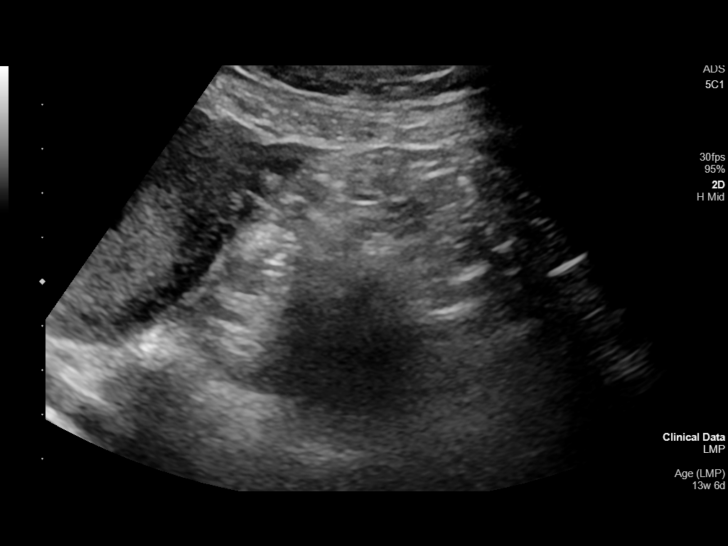
[im 41/45]
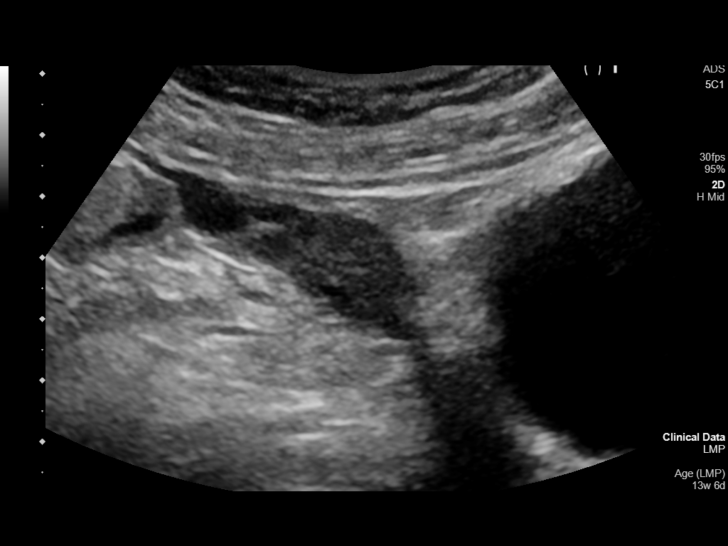
[im 45/45]
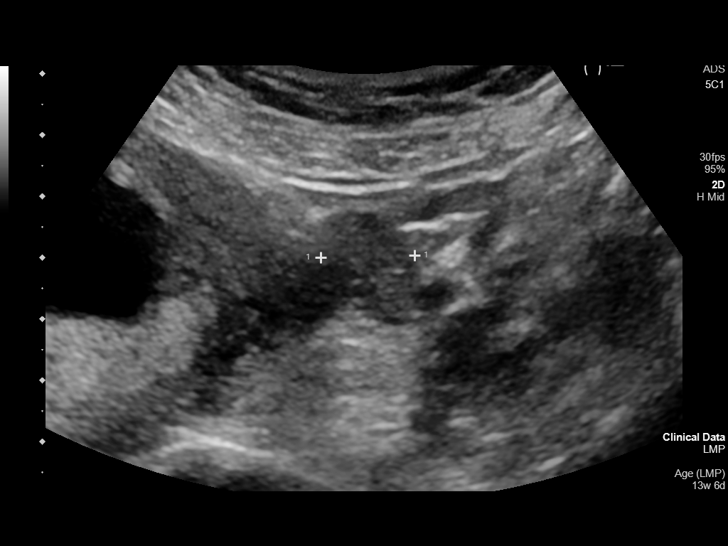

[14 of 28 positions shown; findings below may reference images not displayed]

FINDINGS: Intrauterine gestational sac: Single

Yolk sac:  Not visualized.

Embryo:  Present

Cardiac Activity: Present

Heart Rate: 152 bpm

CRL: 62.0 mm 12 w 4 d                  US EDC: 03/02/2019

Subchorionic hemorrhage:  None visualized.

Maternal uterus/adnexae: Ovaries are normal in appearance
bilaterally. No adnexal mass or free fluid.
IMPRESSION: 1. Single viable intrauterine pregnancy as above without
complication, estimated gestational age 12 weeks and 4 days by
crown-rump length, with ultrasound EDC of 03/02/2019.
2. No other acute maternal uterine or adnexal abnormality
identified.

## 2020-03-23 ENCOUNTER — Ambulatory Visit (LOCAL_COMMUNITY_HEALTH_CENTER): Payer: Self-pay

## 2020-03-23 ENCOUNTER — Other Ambulatory Visit: Payer: Self-pay

## 2020-03-23 VITALS — BP 100/67 | Ht 62.0 in | Wt 117.5 lb

## 2020-03-23 DIAGNOSIS — Z3042 Encounter for surveillance of injectable contraceptive: Secondary | ICD-10-CM

## 2020-03-23 DIAGNOSIS — Z3009 Encounter for other general counseling and advice on contraception: Secondary | ICD-10-CM

## 2020-03-23 DIAGNOSIS — Z30013 Encounter for initial prescription of injectable contraceptive: Secondary | ICD-10-CM

## 2020-03-23 NOTE — Progress Notes (Signed)
Pt is 12.5 weeks post depo today.  DMPA 150 mg IM administered per Arnetha Courser, CNM order dated 04/15/2019.

## 2020-06-15 ENCOUNTER — Other Ambulatory Visit: Payer: Self-pay

## 2020-06-15 ENCOUNTER — Encounter: Payer: Self-pay | Admitting: Physician Assistant

## 2020-06-15 ENCOUNTER — Ambulatory Visit (LOCAL_COMMUNITY_HEALTH_CENTER): Payer: Self-pay | Admitting: Physician Assistant

## 2020-06-15 VITALS — BP 118/71 | Ht <= 58 in | Wt 119.0 lb

## 2020-06-15 DIAGNOSIS — Z01419 Encounter for gynecological examination (general) (routine) without abnormal findings: Secondary | ICD-10-CM

## 2020-06-15 DIAGNOSIS — Z3009 Encounter for other general counseling and advice on contraception: Secondary | ICD-10-CM

## 2020-06-15 DIAGNOSIS — Z3042 Encounter for surveillance of injectable contraceptive: Secondary | ICD-10-CM

## 2020-06-15 MED ORDER — MEDROXYPROGESTERONE ACETATE 150 MG/ML IM SUSP
150.0000 mg | INTRAMUSCULAR | Status: AC
  Administered 2020-06-15 – 2020-11-23 (×3): 150 mg via INTRAMUSCULAR

## 2020-06-15 NOTE — Progress Notes (Unsigned)
Here for PE and Depo today.  Declines HIV/RPR today. Richmond Campbell, RN   Depo- arm; Has had covid vaccine Depo consent signed November 2020

## 2020-06-16 ENCOUNTER — Encounter: Payer: Self-pay | Admitting: Physician Assistant

## 2020-06-16 LAB — IGP, RFX APTIMA HPV ASCU: PAP Smear Comment: 0

## 2020-06-16 NOTE — Progress Notes (Signed)
Family Planning Visit- Repeat Yearly Visit  Subjective:  Angela Terry is a 30 y.o. G2P2002  being seen today for an well woman visit and to discuss family planning options.    She is currently using Depo Provera for pregnancy prevention. Patient reports she does not want a pregnancy in the next year. Patient  has Supervision of other normal pregnancy, antepartum; Previous cesarean section; Elevated white blood cell count; Blood type, Rh positive; Illiteracy; Abnormal glucose tolerance affecting pregnancy, antepartum; Delivery by elective cesarean section; History of cesarean section; and Post-operative state on their problem list.  Chief Complaint  Patient presents with  . Contraception    PE with pap and Depo    Patient reports that she is doing well with the Depo and desires to continue with this as her BCM.  Per chart review CBE is due in 2023 and pap is due today.   Patient denies any concerns today.    See flowsheet for other program required questions.   Body mass index is 26.68 kg/m. - Patient is eligible for diabetes screening based on BMI and age >18?  not applicable HA1C ordered? not applicable  Patient reports 1  partner in last year. Desires STI screening?  No - patient declines.   Has patient been screened once for HCV in the past?  No  No results found for: HCVAB  Does the patient have current of drug use, have a partner with drug use, and/or has been incarcerated since last result? No  If yes-- Screen for HCV through [facility]   Does the patient meet criteria for HBV testing? No  Criteria:  -Household, sexual or needle sharing contact with HBV -History of drug use -HIV positive -Those with known Hep C   Health Maintenance Due  Topic Date Due  . Hepatitis C Screening  Never done  . PAP-Cervical Cytology Screening  Never done  . PAP SMEAR-Modifier  Never done  . INFLUENZA VACCINE  01/12/2020    Review of Systems  All other systems  reviewed and are negative.   The following portions of the patient's history were reviewed and updated as appropriate: allergies, current medications, past family history, past medical history, past social history, past surgical history and problem list. Problem list updated.  Objective:   Vitals:   06/15/20 1512  BP: 118/71  Weight: 119 lb (54 kg)  Height: 4\' 8"  (1.422 m)    Physical Exam Vitals and nursing note reviewed.  Constitutional:      General: She is not in acute distress.    Appearance: Normal appearance.  HENT:     Head: Normocephalic and atraumatic.     Mouth/Throat:     Mouth: Mucous membranes are moist.     Pharynx: Oropharynx is clear. No oropharyngeal exudate or posterior oropharyngeal erythema.  Eyes:     Conjunctiva/sclera: Conjunctivae normal.  Cardiovascular:     Rate and Rhythm: Normal rate and regular rhythm.  Pulmonary:     Effort: Pulmonary effort is normal.     Breath sounds: Normal breath sounds.  Abdominal:     Palpations: Abdomen is soft. There is no mass.     Tenderness: There is no abdominal tenderness. There is no guarding or rebound.  Genitourinary:    General: Normal vulva.     Rectum: Normal.     Comments: External genitalia/pubic area without nits, lice, edema, erythema, lesions and inguinal adenopathy. Vagina with normal mucosa and discharge. Cervix without visible lesions. Uterus  firm, mobile, nt, no masses, no CMT, no adnexal tenderness or fullness. Musculoskeletal:     Cervical back: Neck supple. No tenderness.  Lymphadenopathy:     Cervical: No cervical adenopathy.  Skin:    General: Skin is warm and dry.     Findings: No bruising, erythema, lesion or rash.  Neurological:     Mental Status: She is alert and oriented to person, place, and time.  Psychiatric:        Mood and Affect: Mood normal.        Behavior: Behavior normal.        Thought Content: Thought content normal.        Judgment: Judgment normal.        Assessment and Plan:  Skyla Champagne is a 30 y.o. female G2P2002 presenting to the [facility] Department for an yearly well woman exam/family planning visit  Contraception counseling: Reviewed all forms of birth control options in the tiered based approach. available including abstinence; over the counter/barrier methods; hormonal contraceptive medication including pill, patch, ring, injection,contraceptive implant, ECP; hormonal and nonhormonal IUDs; permanent sterilization options including vasectomy and the various tubal sterilization modalities. Risks, benefits, and typical effectiveness rates were reviewed.  Questions were answered.  Written information was also given to the patient to review.  Patient desires to continue with Depo, this was prescribed for patient. She will follow up in  3 months and prn for surveillance.  She was told to call with any further questions, or with any concerns about this method of contraception.  Emphasized use of condoms 100% of the time for STI prevention.  Patient was not a candidate for ECP today.   1. Encounter for counseling regarding contraception Reviewed with patient normal SE of Depo and when to call clinic for irregular bleeding. Enc condoms with all sex for STD protection.  2. Well woman exam with routine gynecological exam Reviewed with patient healthy habits to maintain general health. Enc MVI 1 po daily. Enc to have weight bearing exercise and adequate Calcium intake for bone health. Enc to establish with/ follow up with PCP for primary care concerns, age appropriate screenings and illness. Await pap results.  Counseled patient that RN will call or send a letter once results are back. 2 - IGP, rfx Aptima HPV ASCU  3. Surveillance for Depo-Provera contraception OK to continue with Depo 150 mg IM q 11-13 weeks for 1 year. - medroxyPROGESTERone (DEPO-PROVERA) injection 150 mg     Return for Depo.  No  future appointments.  Matt Holmes, PA

## 2020-08-24 ENCOUNTER — Other Ambulatory Visit: Payer: Self-pay

## 2020-08-24 ENCOUNTER — Ambulatory Visit (LOCAL_COMMUNITY_HEALTH_CENTER): Payer: Self-pay

## 2020-08-24 VITALS — BP 108/51 | Ht <= 58 in | Wt 120.0 lb

## 2020-08-24 DIAGNOSIS — Z3009 Encounter for other general counseling and advice on contraception: Secondary | ICD-10-CM

## 2020-08-24 DIAGNOSIS — Z3042 Encounter for surveillance of injectable contraceptive: Secondary | ICD-10-CM

## 2020-08-24 NOTE — Progress Notes (Signed)
10 weeks post depo. Voices no concerns. Counseled to schedule depo consistently between 11-13 weeks. Depo given Left deltoid per order by C. [place], [place] dated 06/15/2020. Tolerated well. Next depo due 11/09/2020, pt has reminder card. Lang line, interpreter today. Jerel Shepherd, RN.

## 2020-11-23 ENCOUNTER — Other Ambulatory Visit: Payer: Self-pay

## 2020-11-23 ENCOUNTER — Ambulatory Visit (LOCAL_COMMUNITY_HEALTH_CENTER): Payer: Self-pay

## 2020-11-23 VITALS — BP 99/67 | Ht <= 58 in | Wt 121.0 lb

## 2020-11-23 DIAGNOSIS — Z3042 Encounter for surveillance of injectable contraceptive: Secondary | ICD-10-CM

## 2020-11-23 DIAGNOSIS — Z3009 Encounter for other general counseling and advice on contraception: Secondary | ICD-10-CM

## 2020-11-23 NOTE — Progress Notes (Signed)
13 weeks 0 days post depo. Voices no concerns. Depo given today per order by C. [place], [place] dated 06/15/2020. Tolerated well R deltoid. Next depo due 02/08/2021, has reminder. Depo consent signed today. Juliene Pina, interpreter. Jerel Shepherd, RN

## 2021-04-23 IMAGING — US OBSTETRIC 14+ WK ULTRASOUND
1 series · 13 of 28 positions shown · non-contrast
Comparison: none

CLINICAL DATA: 27-year-old pregnant female presents for fetal
anatomic survey.

EXAM:
OBSTETRICAL ULTRASOUND >14 WKS

[Series 1: obstetric 14+ wk ultrasound · 0.19mm/px · 13 of 109 slices shown]
[im 5/109]
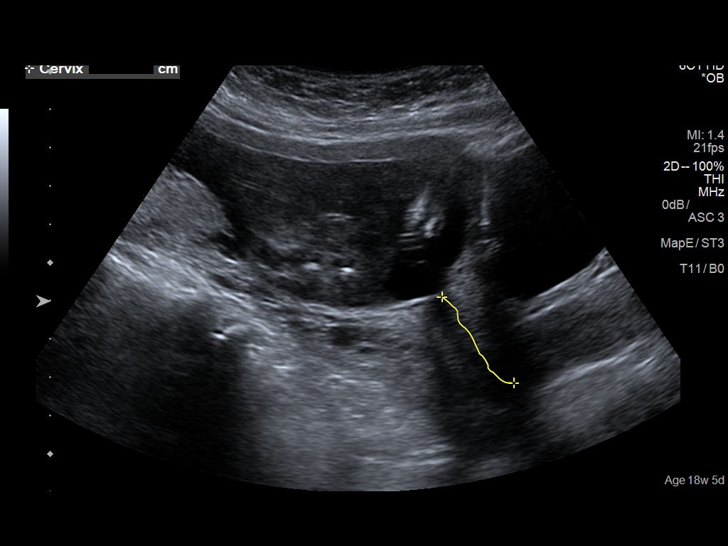
[im 13/109]
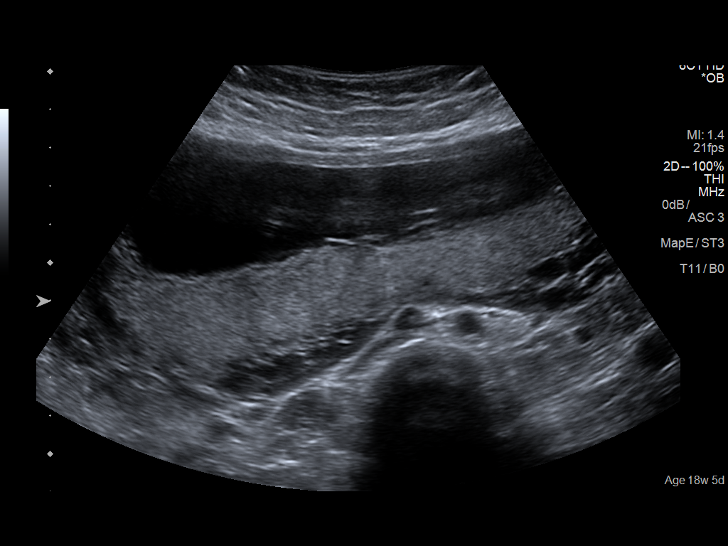
[im 21/109]
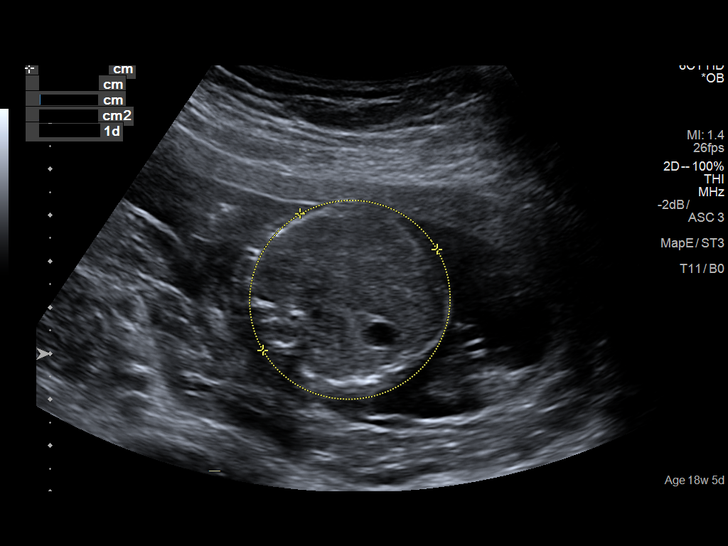
[im 29/109]
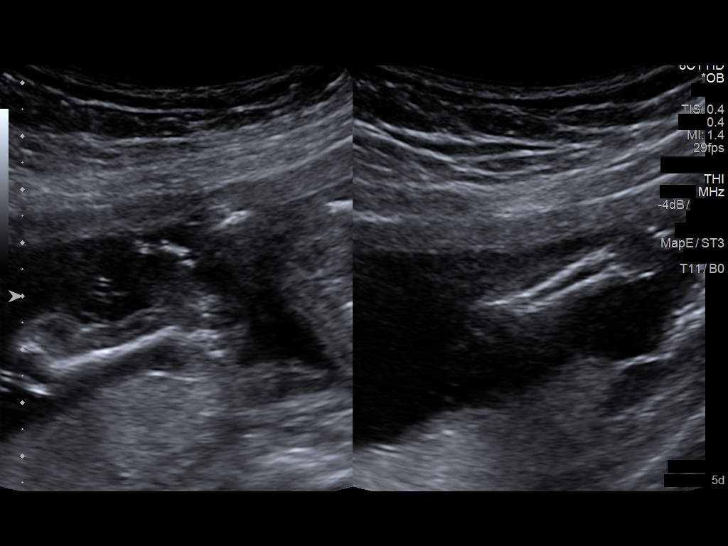
[im 37/109]
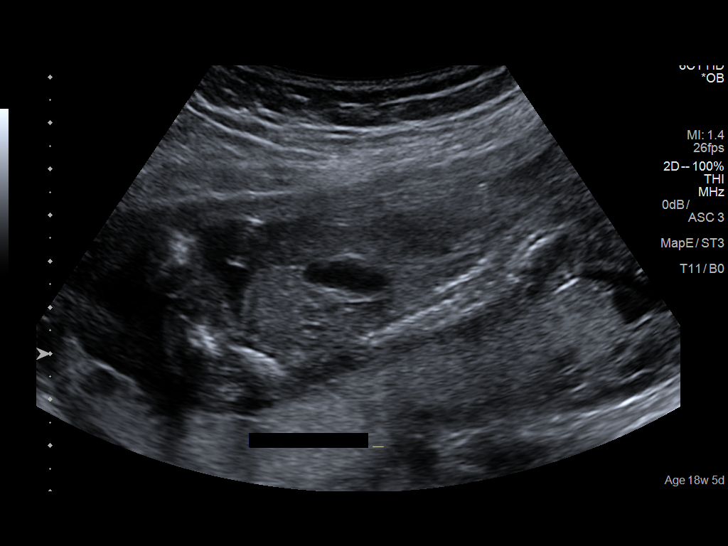
[im 45/109]
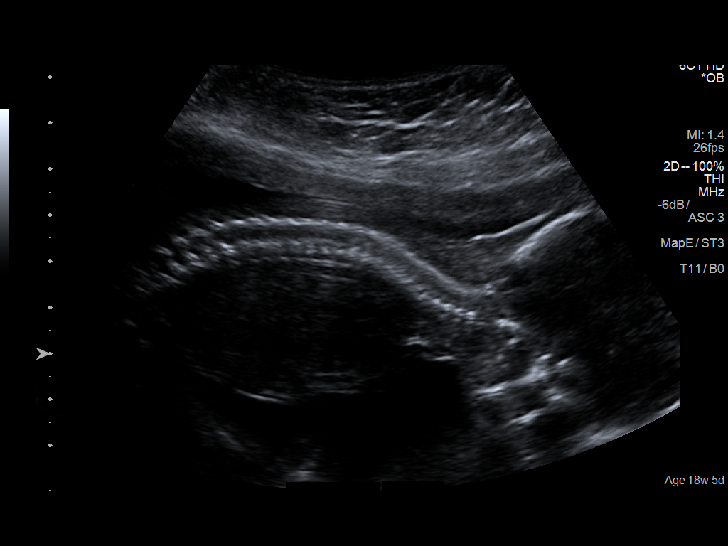
[im 57/109]
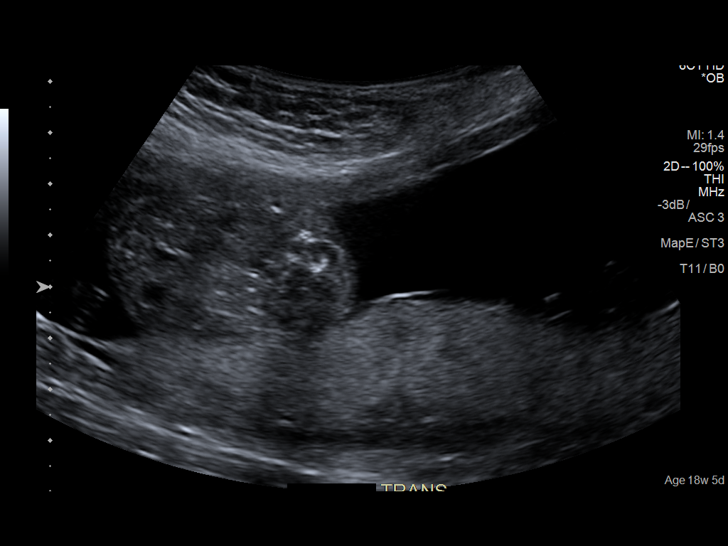
[im 65/109]
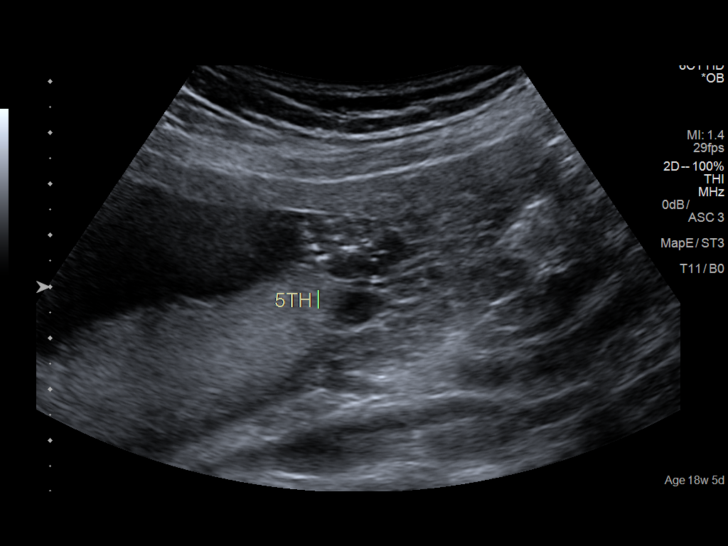
[im 73/109]
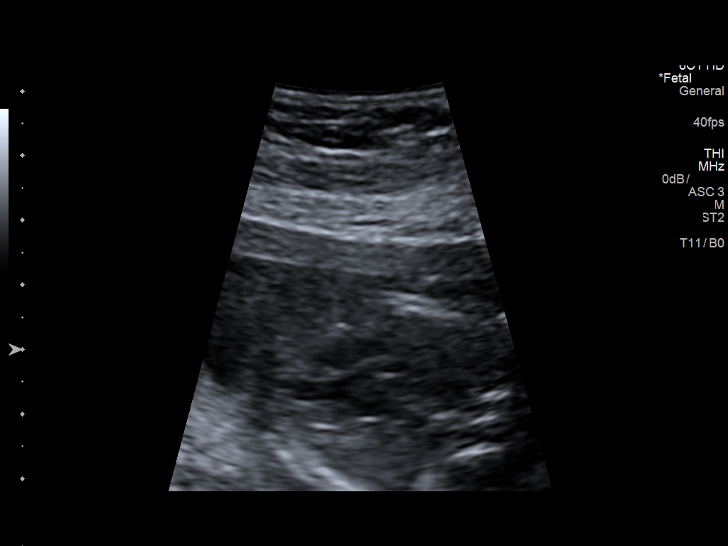
[im 81/109]
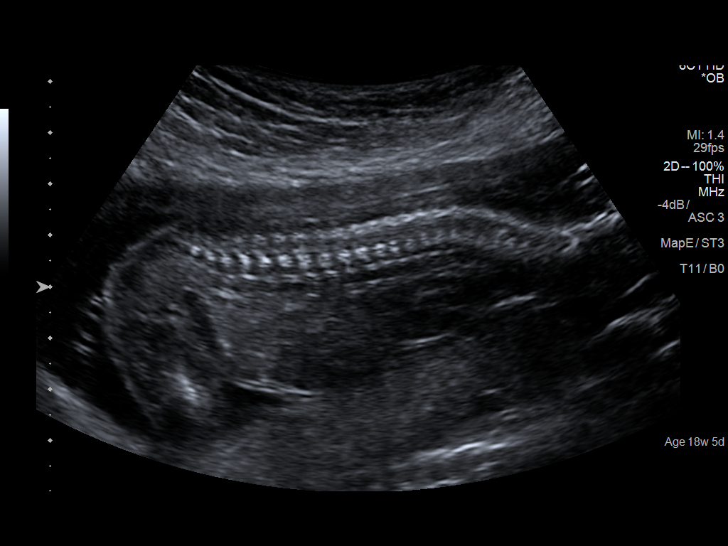
[im 89/109]
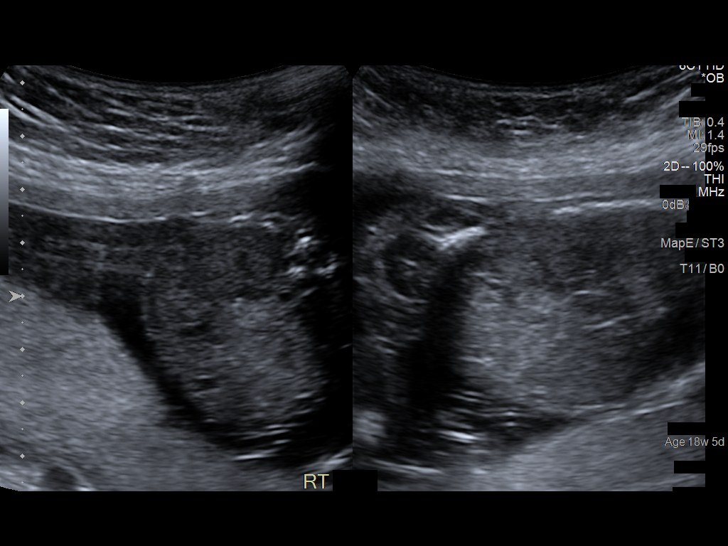
[im 97/109]
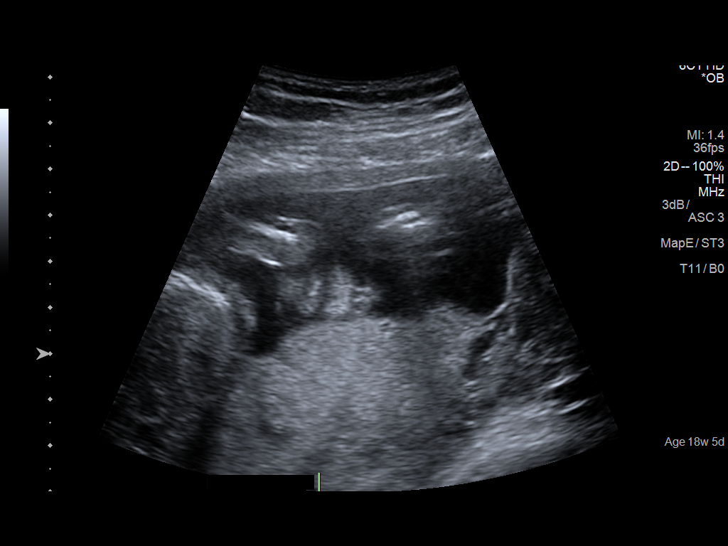
[im 105/109]
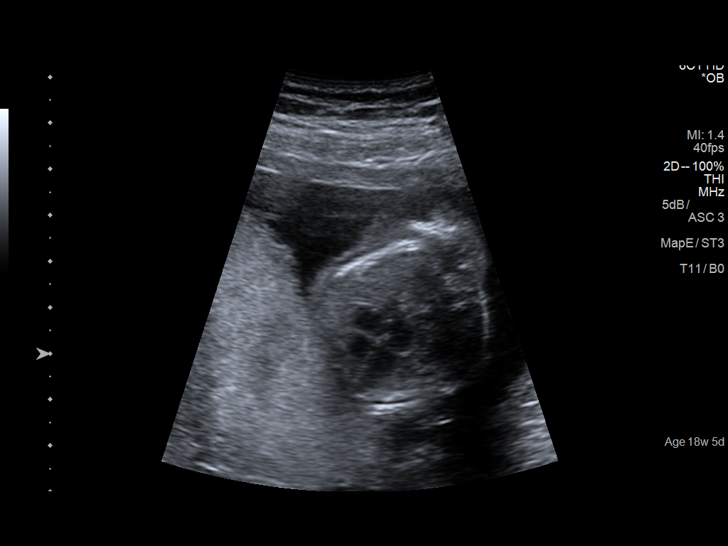

[13 of 28 positions shown; findings below may reference images not displayed]

FINDINGS: Number of Fetuses: 1

Heart Rate:  149 bpm

Movement: Yes

Presentation: Variable

Previa: No

Placental Location: Posterior

Amniotic Fluid (Subjective): Normal

Amniotic Fluid (Objective):

Vertical pocket = 3.2cm

FETAL BIOMETRY

BPD: 4.1cm 18w 3d

HC:   14.9cm 18w 0d

AC:   13.7cm 19w 0d

FL:   2.6cm 18w 2d

Current Mean GA: 18w 3d US EDC: 03/04/2019

Assigned GA:  18w 5d Assigned EDC: 03/02/2019

Estimated Fetal Weight:  246g 36%ile

FETAL ANATOMY

Lateral Ventricles: Appears normal

Thalami/CSP: Appears normal

Posterior Fossa:  Appears normal

Nuchal Region: Appears normal   NFT= 4.7 mm

Upper Lip: Appears normal

Spine: Appears normal

4 Chamber Heart on Left: Appears normal

LVOT: Appears normal on limited views

RVOT: Appears normal on limited these

Stomach on Left: Appears normal

3 Vessel Cord: Appears normal

Cord Insertion site: Appears normal

Kidneys: Appears normal

Bladder: Appears normal

Extremities: Appears normal, 4 extremities demonstrated

Sex: Male external genitalia

Technically difficult due to: Fetal position and early gestational
age

Maternal Findings:

Cervix: Cervix length approximately 3.1 cm on transabdominal views,
with no evidence of internal cervical funneling.
IMPRESSION: 1. Single living intrauterine gestation at 18 weeks 3 days by
average ultrasound age, concordant with provided dating.
2. No fetal or maternal abnormalities detected.

## 2022-03-10 ENCOUNTER — Ambulatory Visit: Payer: Self-pay

## 2022-03-10 ENCOUNTER — Ambulatory Visit (LOCAL_COMMUNITY_HEALTH_CENTER): Payer: Self-pay | Admitting: Nurse Practitioner

## 2022-03-10 ENCOUNTER — Encounter: Payer: Self-pay | Admitting: Nurse Practitioner

## 2022-03-10 VITALS — BP 108/68 | Ht <= 58 in | Wt 124.0 lb

## 2022-03-10 DIAGNOSIS — Z0000 Encounter for general adult medical examination without abnormal findings: Secondary | ICD-10-CM

## 2022-03-10 DIAGNOSIS — Z3009 Encounter for other general counseling and advice on contraception: Secondary | ICD-10-CM

## 2022-03-10 MED ORDER — SM PRENATAL VITAMINS 28-0.8 MG PO TABS: 1.0000 | ORAL_TABLET | Freq: Every day | ORAL | 0 refills | Status: DC

## 2022-03-10 NOTE — Progress Notes (Signed)
Pt is here for PE.  Pt declines STD screening.  FP packet given.  The patient was dispensed Prenatal vitamins #250 today. I provided counseling today regarding the medication. We discussed the medication, the side effects and when to call clinic. Patient given the opportunity to ask questions. Questions answered.

## 2022-03-10 NOTE — Progress Notes (Signed)
[facility] [place] Number: [phone]    Family Planning Visit- Initial Visit  Subjective:  Angela Terry is a 31 y.o.  G2P2002   being seen today for an initial annual visit and to discuss reproductive life planning.  The patient is currently using Pregnant/Seeking Pregnancy for pregnancy prevention. Patient reports   does want a pregnancy in the next year.     report they are looking for a method that provides-other, patient desires pregnancy.   Patient has the following medical conditions has Supervision of other normal pregnancy, antepartum; Previous cesarean section; Elevated [place] blood cell count; Blood type, Rh positive; Illiteracy; Abnormal glucose tolerance affecting pregnancy, antepartum; Delivery by elective cesarean section; History of cesarean section; and Post-operative state on their problem list.  Chief Complaint  Patient presents with   Annual Exam    PE    Patient reports to clinic today for a physical.     Body mass index is 27.8 kg/m. - Patient is eligible for diabetes screening based on BMI and age >47?  not applicable QQ5Z ordered? not applicable  Patient reports 1  partner/s in last year. Desires STI screening?  No - refused  Has patient been screened once for HCV in the past?  No  No results found for: "HCVAB"  Does the patient have current drug use (including MJ), have a partner with drug use, and/or has been incarcerated since last result? No  If yes-- Screen for HCV through [facility]   Does the patient meet criteria for HBV testing? No  Criteria:  -Household, sexual or needle sharing contact with HBV -History of drug use -HIV positive -Those with known Hep C   Health Maintenance Due  Topic Date Due   Hepatitis C Screening  Never done   INFLUENZA VACCINE  01/11/2022    Review of Systems  Constitutional:  Negative for chills, fever, malaise/fatigue  and weight loss.  HENT:  Negative for congestion, hearing loss and sore throat.   Eyes:  Negative for blurred vision, double vision and photophobia.  Respiratory:  Negative for shortness of breath.   Cardiovascular:  Negative for chest pain.  Gastrointestinal:  Negative for abdominal pain, blood in stool, constipation, diarrhea, heartburn, nausea and vomiting.  Genitourinary:  Negative for dysuria and frequency.  Musculoskeletal:  Negative for back pain, joint pain and neck pain.  Skin:  Negative for itching and rash.  Neurological:  Negative for dizziness, weakness and headaches.  Endo/Heme/Allergies:  Does not bruise/bleed easily.  Psychiatric/Behavioral:  Negative for depression, substance abuse and suicidal ideas.     The following portions of the patient's history were reviewed and updated as appropriate: allergies, current medications, past family history, past medical history, past social history, past surgical history and problem list. Problem list updated.   See flowsheet for other program required questions.  Objective:   Vitals:   03/10/22 1020  BP: 108/68  Weight: 124 lb (56.2 kg)  Height: 4\' 8"  (1.422 m)    Physical Exam Constitutional:      Appearance: Normal appearance.  HENT:     Head: Normocephalic. No abrasion, masses or laceration. Hair is normal.     Jaw: No tenderness or swelling.     Right Ear: External ear normal.     Left Ear: External ear normal.     Nose: Nose normal.     Mouth/Throat:     Lips: Pink. No lesions.  Mouth: Mucous membranes are moist. No lacerations or oral lesions.     Dentition: No dental caries.     Tongue: No lesions.     Palate: No mass and lesions.     Pharynx: No pharyngeal swelling, oropharyngeal exudate, posterior oropharyngeal erythema or uvula swelling.     Tonsils: No tonsillar exudate or tonsillar abscesses.     Comments: No visible signs of dental caries  Eyes:     Pupils: Pupils are equal, round, and reactive to  light.  Neck:     Thyroid: No thyroid mass, thyromegaly or thyroid tenderness.  Cardiovascular:     Rate and Rhythm: Normal rate and regular rhythm.  Pulmonary:     Effort: Pulmonary effort is normal.     Breath sounds: Normal breath sounds.  Chest:  Breasts:    Right: Normal. No swelling, mass, nipple discharge, skin change or tenderness.     Left: Normal. No swelling, mass, nipple discharge, skin change or tenderness.  Abdominal:     General: Abdomen is flat. Bowel sounds are normal.     Palpations: Abdomen is soft.     Tenderness: There is no abdominal tenderness. There is no rebound.  Genitourinary:    Comments: Deferred, declined genital screening  Musculoskeletal:     Cervical back: Full passive range of motion without pain and normal range of motion.  Lymphadenopathy:     Cervical: No cervical adenopathy.     Right cervical: No superficial, deep or posterior cervical adenopathy.    Left cervical: No superficial, deep or posterior cervical adenopathy.     Upper Body:     Right upper body: No supraclavicular, axillary or epitrochlear adenopathy.     Left upper body: No supraclavicular, axillary or epitrochlear adenopathy.  Skin:    General: Skin is warm and dry.     Findings: No erythema, laceration, lesion or rash.  Neurological:     Mental Status: She is alert and oriented to person, place, and time.  Psychiatric:        Attention and Perception: Attention normal.        Mood and Affect: Mood normal.        Speech: Speech normal.        Behavior: Behavior normal. Behavior is cooperative.       Assessment and Plan:  Angela Terry is a 31 y.o. female presenting to the [facility] Department for an initial annual wellness/contraceptive visit  Contraception counseling: Reviewed options based on patient desire and reproductive life plan. Patient is interested in Pregnant/Seeking Pregnancy.   Risks, benefits, and typical effectiveness rates were  reviewed.  Questions were answered.  Written information was also given to the patient to review.    The patient will follow up in  1 years for surveillance.  The patient was told to call with any further questions, or with any concerns about this method of contraception.  Emphasized use of condoms 100% of the time for STI prevention.  Need for ECP was assessed.  ECP not offered, due to seeking pregnancy.   1. Family planning counseling -31 year old female in clinic for a physical. -ROS reviewed, no complaints noted. -Declines STD screening today. -Preconception counseling provided.    - Prenatal Vit-Fe Fumarate-FA (SM PRENATAL VITAMINS) 28-0.8 MG TABS; Take 1 tablet by mouth daily.  Dispense: 30 tablet; Refill: 0  2. Well woman exam (no gynecological exam) -Normal well exam. -CBE today, next due 02/2025 -PAP due 06/16/2023  Total time spent: 30 minutes     Return in about 1 year (around 03/11/2023) for Annual well-woman exam.    Glenna Fellows, FNP

## 2022-07-14 DIAGNOSIS — Z8759 Personal history of other complications of pregnancy, childbirth and the puerperium: Secondary | ICD-10-CM

## 2022-07-14 HISTORY — DX: Personal history of other complications of pregnancy, childbirth and the puerperium: Z87.59

## 2022-07-21 ENCOUNTER — Telehealth: Payer: Self-pay

## 2022-07-21 NOTE — Telephone Encounter (Signed)
TC to patient who missed her new OB appointment and states she believes she had a miscarriage. Per U/S at [facility], patient needs to follow up at her provider office for possible miscarriage.  Patient scheduled for Beta Hcg and to talk with provider about follow-up after miscarriage. Patient states understanding. [place], [place] #[phone].Marland KitchenJenetta Downer, RN

## 2022-07-25 ENCOUNTER — Ambulatory Visit: Payer: Self-pay | Admitting: Advanced Practice Midwife

## 2022-07-25 VITALS — Temp 98.7°F

## 2022-07-25 DIAGNOSIS — O039 Complete or unspecified spontaneous abortion without complication: Secondary | ICD-10-CM | POA: Insufficient documentation

## 2022-07-25 LAB — HEMOGLOBIN, FINGERSTICK: Hemoglobin: 12.6 g/dL (ref 11.1–15.9)

## 2022-07-25 NOTE — Progress Notes (Signed)
Patient here for follow-up on possible miscarriage. Patient states she went to [facility] ED on 07/06/22 for vaginal bleeding and they did an ultrasound that found her to be apprx [redacted]w[redacted]d   Patient states she bleed a lot since that visit and she believes she completely miscarried as she had a lot of clots. States she is no longer bleeding at the moment. Beta Hcg during that hospital visit was 12, 101.   EAl Decant RN

## 2022-07-25 NOTE — Progress Notes (Signed)
S:  32 yo HF G3P2 with LMP 04/23/22 went to [facility] ER with bleeding 07/06/22 and u/s revealed 6 3/7 with no FHR, BHCg=12,101. Pt here for f/u O:  98.7, 105/68, no vaginal bleeding since 07/16/22 A/P: probable SAB. BHCg today to compare

## 2022-07-25 NOTE — Progress Notes (Signed)
Hgb (12.6) reviewed - no treatment indicated.   Al Decant, RN

## 2022-07-26 LAB — BETA HCG QUANT (REF LAB): hCG Quant: 5 m[IU]/mL

## 2022-07-27 ENCOUNTER — Telehealth: Payer: Self-pay

## 2022-07-27 NOTE — Telephone Encounter (Signed)
Per Ola Spurr CNM order (attached to last beta hcg result), client needs repeat beta hcg in one week. Call to client with [facility] [employer] ID # [phone] and appt schedule 08/01/2022 in [facility] with arrival time of 0850. Rich Number, RN

## 2022-08-01 ENCOUNTER — Other Ambulatory Visit: Payer: Self-pay

## 2022-08-01 DIAGNOSIS — O039 Complete or unspecified spontaneous abortion without complication: Secondary | ICD-10-CM

## 2022-08-01 NOTE — Progress Notes (Signed)
In nurse clinic for Beta HCG as ordered by Ola Spurr, CNM dated 07/26/2022. RN walked pt to lab. Angela Mages [place] interpreter today. Josie Saunders, RN

## 2022-08-02 ENCOUNTER — Telehealth: Payer: Self-pay

## 2022-08-02 LAB — BETA HCG QUANT (REF LAB): hCG Quant: 1 m[IU]/mL

## 2022-08-02 NOTE — Telephone Encounter (Signed)
Call to patient per E. Sciora, CNM result note to notify her on Beta Hcg level has lowered to 1.   Patient had questions and consulted with E. Sciora, CNM.   Patient's first question was when she can resume sexual intercourse? Per provider, patient can resume sexual activities when she is ready. As long as she is no longer bleeding vaginally or having abdominal pain. Patient states she is not having vaginal bleeding and she denied having abdominal pain as well.   Patient's 2nd question is asking how soon she can try again to become pregnant. Per provider, she informed me to tell patient that she can start trying to conceive again after she has has 1 normal menstrual period. Until she has a normal period, she suggested to use condoms or OCP until she has a normal period.   Patient verbalized understanding and she states she desires to have Rx for OCP to use until she has per period.   RN spoke with E. Sciora, CNM and provider okay seeing her for an acute visit to prescribe 1-2 packs of OCP.   Appointment scheduled for Monday 08/08/22 for 10:45 arrival time for visit to prescribe 1-2 packs of OCP. Patient verbalized understanding.   Al Decant, RN

## 2022-08-08 ENCOUNTER — Ambulatory Visit: Payer: Self-pay

## 2023-02-10 ENCOUNTER — Ambulatory Visit (LOCAL_COMMUNITY_HEALTH_CENTER): Payer: Self-pay

## 2023-02-10 VITALS — BP 119/67 | Ht <= 58 in | Wt 126.0 lb

## 2023-02-10 DIAGNOSIS — Z3201 Encounter for pregnancy test, result positive: Secondary | ICD-10-CM

## 2023-02-10 DIAGNOSIS — Z3009 Encounter for other general counseling and advice on contraception: Secondary | ICD-10-CM

## 2023-02-10 LAB — PREGNANCY, URINE: Preg Test, Ur: POSITIVE — AB

## 2023-02-10 MED ORDER — PRENATAL 27-0.8 MG PO TABS: 1.0000 | ORAL_TABLET | Freq: Every day | ORAL | Status: DC

## 2023-02-10 NOTE — Progress Notes (Signed)
UPT positive. Plans prenatal care at [facility]. Postiive preg packet given and reviewed.   The patient was dispensed prenatal vitamins #100 today per SO Dr Lorrin Mais.  I provided counseling today regarding the medication. We discussed the medication, the side effects and when to call clinic. Patient given the opportunity to ask questions. Questions answered.    Sent to clerk for preadmit. Judie Petit [place], interpreter today. Jerel Shepherd, RN

## 2023-03-20 ENCOUNTER — Encounter: Payer: Self-pay | Admitting: Advanced Practice Midwife

## 2023-03-20 ENCOUNTER — Ambulatory Visit: Payer: Medicaid Other | Admitting: Advanced Practice Midwife

## 2023-03-20 VITALS — BP 98/65 | HR 86 | Temp 98.5°F | Wt 120.6 lb

## 2023-03-20 DIAGNOSIS — Z3481 Encounter for supervision of other normal pregnancy, first trimester: Secondary | ICD-10-CM

## 2023-03-20 DIAGNOSIS — Z23 Encounter for immunization: Secondary | ICD-10-CM

## 2023-03-20 DIAGNOSIS — Z98891 History of uterine scar from previous surgery: Secondary | ICD-10-CM

## 2023-03-20 DIAGNOSIS — O09899 Supervision of other high risk pregnancies, unspecified trimester: Secondary | ICD-10-CM | POA: Insufficient documentation

## 2023-03-20 LAB — WET PREP FOR TRICH, YEAST, CLUE
Trichomonas Exam: NEGATIVE
Yeast Exam: NEGATIVE

## 2023-03-20 LAB — HEMOGLOBIN, FINGERSTICK: Hemoglobin: 13.1 g/dL (ref 11.1–15.9)

## 2023-03-20 NOTE — Progress Notes (Signed)
[facility] Department  Maternal Health Clinic   INITIAL PRENATAL VISIT NOTE  Subjective:  Angela Terry is a 32 y.o. MHF nonsmoker [id number] (13,4) at [redacted]w[redacted]d being seen today to start prenatal care at the [facility] Department.  She feels "happy" about planned pregnancy with last DMPA 2023. 32 yo employed husband of 10 years feels "also happy" about pregnancy and supportive. She has been in the [place] x 10 years from [place] and is living with her husband and 2 kids. LMP 12/24/22. Not working. Denies u/s or ER use this pregnancy. Last pap 06/15/20 neg. Never had dental exam. Never had vision exam. Highest grade completed=3rd. Denies cigs, vaping, cigars, MJ, ETOH. 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  Patient reports nausea.  Contractions: Not present.  .  Movement: Absent. Denies leaking of fluid.   Indications for ASA therapy (per uptodate) One of the following: Previous pregnancy with preeclampsia, especially early onset and with an adverse outcome No Multifetal gestation No Chronic hypertension No Type 1 or 2 diabetes mellitus No Chronic kidney disease No Autoimmune disease (antiphospholipid syndrome, systemic lupus erythematosus) No  Two or more of the following: Nulliparity No Obesity (body mass index >30 kg/m2) No Family history of preeclampsia in mother or sister No Age >=35 years No Sociodemographic characteristics (African American race, low socioeconomic level) No Personal risk factors (eg, previous pregnancy with low birth weight or small for gestational age infant, previous adverse pregnancy outcome [eg, stillbirth], interval >10 years between pregnancies) No   The following portions of the patient's history were reviewed and updated as appropriate: allergies,  current medications, past family history, past medical history, past social history, past surgical history and problem list. Problem list updated.  Objective:   Vitals:   03/20/23 0917  BP: 98/65  Pulse: 86  Temp: 98.5 F (36.9 C)  Weight: 120 lb 9.6 oz (54.7 kg)    Fetal Status: Fetal Heart Rate (bpm): 160 Fundal Height: 12 cm Movement: Absent  Presentation: Undeterminable   Physical Exam Vitals and nursing note reviewed.  Constitutional:      General: She is not in acute distress.    Appearance: Normal appearance. She is well-developed.  HENT:     Head: Normocephalic and atraumatic.     Right Ear: External ear normal.     Left Ear: External ear normal.     Nose: Nose normal. No congestion or rhinorrhea.     Mouth/Throat:     Lips: Pink.     Mouth: Mucous membranes are moist.     Dentition: Normal dentition. No dental caries.     Pharynx: Oropharynx is clear. Uvula midline.     Comments: Dentition: fair; never had dental exam Eyes:     General: No scleral icterus.    Conjunctiva/sclera: Conjunctivae normal.  Neck:     Thyroid: No thyroid mass, thyromegaly or thyroid tenderness.  Cardiovascular:     Rate and Rhythm: Normal rate.     Pulses: Normal pulses.     Comments: Extremities are warm and well perfused Pulmonary:     Effort: Pulmonary effort is normal.     Breath sounds: Normal breath sounds.  Chest:     Chest wall: No mass.  Breasts:    Tanner Score is 5.     Breasts are symmetrical.  Right: Normal. No mass, nipple discharge or skin change.     Left: Normal. No mass, nipple discharge or skin change.  Abdominal:     Palpations: Abdomen is soft.     Tenderness: There is no abdominal tenderness.     Comments: Gravid, soft without masses or tenderness, FH=12, FHR=160  Genitourinary:    General: Normal vulva.     Exam position: Lithotomy position.     Pubic Area: No rash.      Labia:        Right: No rash.        Left: No rash.      Vagina: Vaginal  discharge (white creamy leukorrhea, ph<4.5) present.     Cervix: No cervical motion tenderness or friability.     Uterus: Normal. Enlarged (Gravid 12 wks size, FHR=160). Not tender.      Rectum: Normal. No external hemorrhoid.  Musculoskeletal:     Right lower leg: No edema.     Left lower leg: No edema.  Lymphadenopathy:     Cervical: No cervical adenopathy.     Upper Body:     Right upper body: No axillary adenopathy.     Left upper body: No axillary adenopathy.  Skin:    General: Skin is warm.     Capillary Refill: Capillary refill takes less than 2 seconds.  Neurological:     Mental Status: She is alert.     Assessment and Plan:  Pregnancy: [id number] at [redacted]w[redacted]d  1. Prenatal care, subsequent pregnancy, first trimester Counseled on weight gain of 15-25 lbs this pregnancy Desires NIPS Dating u/s ordered  - Lead, blood (adult age 42 yrs or greater) - Prenatal Profile I - MaterniT 21 plus Core, Blood - ToxAssure Flex 15, Ur - WET PREP FOR TRICH, YEAST, CLUE - Hemoglobin, venipuncture - US OB Comp Less 14 Wks; Future  2. History of C-section x2 Needs repeat c/s Will need delivery plans apt with [facility]  - US OB Comp Less 14 Wks; Future    Discussed overview of care and coordination with inpatient delivery practices including [facility],  [facility].   Reviewed Centering pregnancy as standard of care at [facility]   Preterm labor symptoms and general obstetric precautions including but not limited to vaginal bleeding, contractions, leaking of fluid and fetal movement were reviewed in detail with the patient.  Please refer to After Visit Summary for other counseling recommendations.   Return in about 4 weeks (around 04/17/2023) for routine PNC.  No future appointments.  Alberteen Spindle, CNM

## 2023-03-20 NOTE — Progress Notes (Addendum)
Presents for initiation of prenatal care and denies care prior medical care thus far in pregnancy. Born in [place] and in the [place] x10 years. Per client, no international travel since negative Quantiferon TB Gold test in 08/2018. Normal hgb fractionation and varicella immune per titer in 08/2018. Above 3 test printed from [facility], labeled and sent for scanning. Desires MaterniT-21 testing today. EPDS score = 0. Jossie Ng, RN Counseled [place] scheduled for Tuesday, 03/21/23 with arrival time of 10 AM. [facility] map with appt reminder given to client. Counseled to drink 4 glasses of water by 9:45 am and not use the bathroom. Client verbalized understanding. Jossie Ng, RN Wet prep negative and hgb = 13.1. No interventions required per standing order. Jossie Ng, RN 4 week [facility] RV appt scheduled and appt reminder card given. Jossie Ng, RN

## 2023-03-21 ENCOUNTER — Ambulatory Visit
Admission: RE | Admit: 2023-03-21 | Discharge: 2023-03-21 | Disposition: A | Discharge status: Home / Self Care | Payer: Medicaid Other | Source: Ambulatory Visit | Attending: Advanced Practice Midwife | Admitting: Advanced Practice Midwife

## 2023-03-21 DIAGNOSIS — Z3A11 11 weeks gestation of pregnancy: Secondary | ICD-10-CM | POA: Diagnosis not present

## 2023-03-21 DIAGNOSIS — Z3481 Encounter for supervision of other normal pregnancy, first trimester: Secondary | ICD-10-CM | POA: Insufficient documentation

## 2023-03-21 DIAGNOSIS — Z98891 History of uterine scar from previous surgery: Secondary | ICD-10-CM | POA: Insufficient documentation

## 2023-03-21 DIAGNOSIS — Z3687 Encounter for antenatal screening for uncertain dates: Secondary | ICD-10-CM | POA: Diagnosis present

## 2023-03-24 LAB — PREGNANCY, INITIAL SCREEN
Antibody Screen: NEGATIVE
Basophils Absolute: 0 10*3/uL (ref 0.0–0.2)
Basos: 0 %
Bilirubin, UA: NEGATIVE
Chlamydia trachomatis, NAA: NEGATIVE
EOS (ABSOLUTE): 0.4 10*3/uL (ref 0.0–0.4)
Eos: 3 %
Glucose, UA: NEGATIVE
HCV Ab: NONREACTIVE
HIV Screen 4th Generation wRfx: NONREACTIVE
Hematocrit: 39.9 % (ref 34.0–46.6)
Hemoglobin: 13.1 g/dL (ref 11.1–15.9)
Hepatitis B Surface Ag: NEGATIVE
Immature Grans (Abs): 0.1 10*3/uL (ref 0.0–0.1)
Immature Granulocytes: 1 %
Ketones, UA: NEGATIVE
Leukocytes,UA: NEGATIVE
Lymphocytes Absolute: 3.3 10*3/uL — ABNORMAL HIGH (ref 0.7–3.1)
Lymphs: 24 %
MCH: 30.1 pg (ref 26.6–33.0)
MCHC: 32.8 g/dL (ref 31.5–35.7)
MCV: 92 fL (ref 79–97)
Monocytes Absolute: 0.6 10*3/uL (ref 0.1–0.9)
Monocytes: 4 %
Neisseria Gonorrhoeae by PCR: NEGATIVE
Neutrophils Absolute: 9.4 10*3/uL — ABNORMAL HIGH (ref 1.4–7.0)
Neutrophils: 68 %
Nitrite, UA: NEGATIVE
Platelets: 353 10*3/uL (ref 150–450)
Protein,UA: NEGATIVE
RBC, UA: NEGATIVE
RBC: 4.35 x10E6/uL (ref 3.77–5.28)
RDW: 12.8 % (ref 11.7–15.4)
RPR Ser Ql: NONREACTIVE
Rh Factor: POSITIVE
Rubella Antibodies, IGG: 3.19 {index} (ref >0.99)
Specific Gravity, UA: 1.016 (ref 1.005–1.030)
Urobilinogen, Ur: 0.2 mg/dL (ref 0.2–1.0)
WBC: 13.7 10*3/uL — ABNORMAL HIGH (ref 3.4–10.8)
pH, UA: 6 (ref 5.0–7.5)

## 2023-03-24 LAB — MICROSCOPIC EXAMINATION
Bacteria, UA: NONE SEEN
Casts: NONE SEEN /[LPF]
WBC, UA: NONE SEEN /[HPF] (ref 0–5)

## 2023-03-24 LAB — TOXASSURE FLEX 15, UR
6-ACETYLMORPHINE IA: NEGATIVE ng/mL
7-aminoclonazepam: NOT DETECTED ng/mg{creat}
AMPHETAMINES IA: NEGATIVE ng/mL
Alpha-hydroxyalprazolam: NOT DETECTED ng/mg{creat}
Alpha-hydroxymidazolam: NOT DETECTED ng/mg{creat}
Alpha-hydroxytriazolam: NOT DETECTED ng/mg{creat}
Alprazolam: NOT DETECTED ng/mg{creat}
BARBITURATES IA: NEGATIVE ng/mL
BUPRENORPHINE: NEGATIVE
Benzodiazepines: NEGATIVE
Buprenorphine: NOT DETECTED ng/mg{creat}
CANNABINOIDS IA: NEGATIVE ng/mL
COCAINE METABOLITE IA: NEGATIVE ng/mL
Clonazepam: NOT DETECTED ng/mg{creat}
Creatinine: 112 mg/dL
Desalkylflurazepam: NOT DETECTED ng/mg{creat}
Desmethyldiazepam: NOT DETECTED ng/mg{creat}
Desmethylflunitrazepam: NOT DETECTED ng/mg{creat}
Diazepam: NOT DETECTED ng/mg{creat}
ETHYL ALCOHOL Enzymatic: NEGATIVE g/dL
FENTANYL: NEGATIVE
Fentanyl: NOT DETECTED ng/mg{creat}
Flunitrazepam: NOT DETECTED ng/mg{creat}
Lorazepam: NOT DETECTED ng/mg{creat}
METHADONE IA: NEGATIVE ng/mL
METHADONE MTB IA: NEGATIVE ng/mL
Midazolam: NOT DETECTED ng/mg{creat}
Norbuprenorphine: NOT DETECTED ng/mg{creat}
Norfentanyl: NOT DETECTED ng/mg{creat}
OPIATE CLASS IA: NEGATIVE ng/mL
OXYCODONE CLASS IA: NEGATIVE ng/mL
Oxazepam: NOT DETECTED ng/mg{creat}
PHENCYCLIDINE IA: NEGATIVE ng/mL
TAPENTADOL, IA: NEGATIVE ng/mL
TRAMADOL IA: NEGATIVE ng/mL
Temazepam: NOT DETECTED ng/mg{creat}

## 2023-03-24 LAB — MATERNIT 21 PLUS CORE, BLOOD
Fetal Fraction: 19
Result (T21): NEGATIVE
Trisomy 13 (Patau syndrome): NEGATIVE
Trisomy 18 (Edwards syndrome): NEGATIVE
Trisomy 21 (Down syndrome): NEGATIVE

## 2023-03-24 LAB — HCV INTERPRETATION

## 2023-03-24 LAB — LEAD, BLOOD (ADULT >= 16 YRS): Lead-Whole Blood: <1 ug/dL (ref 0.0–3.4)

## 2023-03-24 LAB — URINE CULTURE, OB REFLEX

## 2023-03-28 ENCOUNTER — Encounter: Payer: Self-pay | Admitting: Advanced Practice Midwife

## 2023-04-11 NOTE — Addendum Note (Signed)
Addended by: Heywood Bene on: 04/11/2023 10:37 AM   Modules accepted: Orders

## 2023-04-17 ENCOUNTER — Ambulatory Visit: Payer: Medicaid Other | Admitting: Advanced Practice Midwife

## 2023-04-17 ENCOUNTER — Telehealth: Payer: Self-pay

## 2023-04-17 VITALS — BP 101/69 | HR 100 | Temp 98.6°F | Wt 122.0 lb

## 2023-04-17 DIAGNOSIS — Z3482 Encounter for supervision of other normal pregnancy, second trimester: Secondary | ICD-10-CM | POA: Diagnosis not present

## 2023-04-17 DIAGNOSIS — Z550 Illiteracy and low-level literacy: Secondary | ICD-10-CM

## 2023-04-17 DIAGNOSIS — Z98891 History of uterine scar from previous surgery: Secondary | ICD-10-CM

## 2023-04-17 DIAGNOSIS — Z3481 Encounter for supervision of other normal pregnancy, first trimester: Secondary | ICD-10-CM

## 2023-04-17 NOTE — Telephone Encounter (Signed)
TC to patient to inform of [facility] anatomy U/S on 05/10/23. Needs to arrive at 10:45 with full bladder. Patient states understanding and counseled this is the same place as her previous U/S. [address] [place], [place] [id number].Marland KitchenBurt Knack, RN

## 2023-04-17 NOTE — Progress Notes (Signed)
[facility] Health Department Maternal Health Clinic  PRENATAL VISIT NOTE  Subjective:  Angela Terry is a 32 y.o. [phone] at [redacted]w[redacted]d being seen today for ongoing prenatal care.  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  Patient reports no complaints.  Contractions: Not present. Vag. Bleeding: None.  Movement: Absent. Denies leaking of fluid/ROM.   The following portions of the patient's history were reviewed and updated as appropriate: allergies, current medications, past family history, past medical history, past social history, past surgical history and problem list. Problem list updated.  Objective:   Vitals:   04/17/23 1046  BP: 101/69  Pulse: 100  Temp: 98.6 F (37 C)  Weight: 122 lb (55.3 kg)    Fetal Status: Fetal Heart Rate (bpm): 150 Fundal Height: 14 cm Movement: Absent     General:  Alert, oriented and cooperative. Patient is in no acute distress.  Skin: Skin is warm and dry. No rash noted.   Cardiovascular: Normal heart rate noted  Respiratory: Normal respiratory effort, no problems with respiration noted  Abdomen: Soft, gravid, appropriate for gestational age.  Pain/Pressure: Absent     Pelvic: Cervical exam deferred        Extremities: Normal range of motion.  Edema: None  Mental Status: Normal mood and affect. Normal behavior. Normal judgment and thought content.   Assessment and Plan:  Pregnancy: [id number] at [redacted]w[redacted]d  1. Prenatal care, subsequent pregnancy, first trimester NIPS neg 03/20/23 AFP only today Hasn't made dental apt yet; strongly encouraged to do so as has never had dental apt Walking 1x/wk x 1 hour Reviewed 03/21/23 u/s at 11 6/7  To order anatomy u/s Not working -2 lb (-0.907 kg) 2 lb wt gain in last 4 wks  - AFP, Serum, Open Spina Bifida - US OB  Comp + 14 Wk; Future  2. 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 History of C-section x2 Needs repeat c/s  - US OB Comp + 14 Wk; Future   Preterm labor symptoms and general obstetric precautions including but not limited to vaginal bleeding, contractions, leaking of fluid and fetal movement were reviewed in detail with the patient. Please refer to After Visit Summary for other counseling recommendations.  Return in about 4 weeks (around 05/15/2023) for routine PNC.  No future appointments.  Alberteen Spindle, CNM

## 2023-04-17 NOTE — Progress Notes (Addendum)
Patient here for MH RV at 15 5/7. Desires AFP test today. Patient would like to have anatomy U/S before her Medicaid runs out at the end of November.Marland KitchenMarland KitchenMarland KitchenBurt Knack, RN

## 2023-04-19 LAB — AFP, SERUM, OPEN SPINA BIFIDA
AFP MoM: 0.65
AFP Value: 23.1 ng/mL
Gest. Age on Collection Date: 15.5 wk
Maternal Age At EDD: 32.8 a
OSBR Risk 1 IN: 10000
Test Results:: NEGATIVE
Weight: 122 [lb_av]

## 2023-05-10 ENCOUNTER — Ambulatory Visit
Admission: RE | Admit: 2023-05-10 | Discharge: 2023-05-10 | Disposition: A | Discharge status: Home / Self Care | Payer: Medicaid Other | Source: Ambulatory Visit | Attending: Advanced Practice Midwife | Admitting: Advanced Practice Midwife

## 2023-05-10 DIAGNOSIS — Z3A18 18 weeks gestation of pregnancy: Secondary | ICD-10-CM | POA: Diagnosis not present

## 2023-05-10 DIAGNOSIS — Z98891 History of uterine scar from previous surgery: Secondary | ICD-10-CM | POA: Insufficient documentation

## 2023-05-10 DIAGNOSIS — Z3481 Encounter for supervision of other normal pregnancy, first trimester: Secondary | ICD-10-CM | POA: Insufficient documentation

## 2023-05-10 DIAGNOSIS — Z3689 Encounter for other specified antenatal screening: Secondary | ICD-10-CM | POA: Insufficient documentation

## 2023-05-15 ENCOUNTER — Ambulatory Visit: Payer: Self-pay | Admitting: Family Medicine

## 2023-05-15 VITALS — BP 105/65 | HR 104 | Temp 98.1°F | Wt 124.0 lb

## 2023-05-15 DIAGNOSIS — Z3481 Encounter for supervision of other normal pregnancy, first trimester: Secondary | ICD-10-CM

## 2023-05-15 DIAGNOSIS — Z3482 Encounter for supervision of other normal pregnancy, second trimester: Secondary | ICD-10-CM

## 2023-05-15 MED ORDER — PRENATAL VITAMIN 27-0.8 MG PO TABS
1.0000 | ORAL_TABLET | Freq: Every day | ORAL | Status: DC
Start: 2023-05-15 — End: 2023-07-31

## 2023-05-15 NOTE — Patient Instructions (Addendum)
I have translated the following text using Google translate.  As such, there are many errors.  I apologize for the poor written translation; however, we do not have written  translation services yet. It was wonderful to see you today. Thank you for allowing me to be a part of your care. Below is a short summary of what we discussed at your visit today: He traducido el siguiente texto con el traductor de Microbiologist. Por lo tanto, hay muchos errores. Pido disculpas por la mala traduccin escrita; sin embargo, an no contamos con servicios de traduccin escrita. Fue maravilloso verte hoy. Gracias por permitirme ser parte de tu atencin. A continuacin, se incluye un breve resumen de lo que hablamos en tu visita de hoy:  Pregnancy Continue taking your prenatal vitamin.  Next visit in 2 weeks.  Embarazo Contina tomando tus vitaminas prenatales. Prxima visita en 2 semanas.  Prenatal Classes Go to OnSiteLending.nl for more information on the pregnancy and child birth classes that [facility] has to offer.   Clases prenatales Visita OnSiteLending.nl para obtener ms informacin sobre las clases de Psychiatrist y parto que ofrece [place].  Resource regarding exposures that could affect pregnancy: Mother To [facility] [employer] is a Dentist with lots of information on the effects of many medications and exposures on your pregnancy. It is free to use, including their web site, phone line, text service, app, or email and live chat.  http://golden-[email or web address].org/ Email or live chat: MotherToBaby.org Phone: [phone] Text: [phone] App: search "LactRx" Recursos sobre exposiciones que podran afectar el embarazo: Mother To Baby es una organizacin sin fines de lucro con mucha informacin [employer] efectos de muchos medicamentos y exposiciones en tu Psychiatrist. Es de uso gratuito, incluido su sitio web, lnea telefnica,  servicio de mensajes de texto, aplicacin o correo electrnico y chat en vivo. http://golden-[email or web address]/ Correo electrnico o chat en vivo: MotherToBaby.org Telfono: [phone] Mensaje de texto: [phone] Aplicacin: busque "LactRx"  Maternal Mental Health Salud Mental Materna Si comienza a desarrollar los siguientes sntomas de depresin, comunquese con nosotros para programar una cita. Tambin hay una lnea directa nacional de salud mental materna en el [phone]). Esta lnea directa ha capacitado a consejeros, doulas y parteras para brindar apoyo, informacin y Occupational hygienist real.  [place] Sizer triste o sin esperanza la mayor parte del tiempo  Falta de inters en las cosas que sola disfrutar  Menos inters en cuidarse a s mismo (vestirse, arreglarse el cabello)  Problemas para concentrarse  Problemas para hacer frente a las tareas diarias  Preocupacin constante por su beb  Dormir o Microbiologist o muy poco  Sentirse muy ansioso o nervioso  Irritabilidad o ira inexplicables  Pensamientos no deseados o Insurance underwriter que no eres una buena madre  Pensamientos de Runner, broadcasting/film/video a s Visual merchandiser o a su beb  Si cree que est experimentando una crisis de salud mental, comunquese con la [employer] de Prevencin del Suicidio al 1-800-273-TALK [phone]).    Fayette Pho, MD [facility] Department

## 2023-05-15 NOTE — Assessment & Plan Note (Addendum)
Doing well. BP normal. TWG 0 lb. Normal anatomy US, discussed - anatomy unremarkable, anterior placenta, 3 vessel cord. No EFW %ile or fetal gender, will message radiologist for amendment.   Plan for delivery planning appointment with [facility] between 32 and 34 weeks.

## 2023-05-15 NOTE — Progress Notes (Signed)
PNV given and patient scheduled for return visit.Burt Knack, RN

## 2023-05-15 NOTE — Progress Notes (Signed)
[facility] Department Maternal Health Clinic  PRENATAL VISIT NOTE Phone Spanish interpreter used for visit. [place], [place] #[id number] Subjective:  Angela Terry is a 32 y.o. [phone] at [redacted]w[redacted]d being seen today for ongoing prenatal care.  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  Patient reports backache.  Contractions: Not present. Vag. Bleeding: None.  Movement: Present. Denies leaking of fluid/ROM.   The following portions of the patient's history were reviewed and updated as appropriate: allergies, current medications, past family history, past medical history, past social history, past surgical history and problem list. Problem list updated.  Objective:   Vitals:   05/15/23 1040  BP: 105/65  Pulse: (!) 104  Temp: 98.1 F (36.7 C)  Weight: 124 lb (56.2 kg)   Fetal Status: Fetal Heart Rate (bpm): 152 Fundal Height: 21 cm Movement: Present     General:  Alert, oriented and cooperative. Patient is in no acute distress.  Skin: Skin is warm and dry. No rash noted.   Cardiovascular: Normal heart rate noted  Respiratory: Normal respiratory effort, no problems with respiration noted  Abdomen: Soft, gravid, appropriate for gestational age.  Pain/Pressure: Absent     Pelvic: Cervical exam deferred        Extremities: Normal range of motion.  Edema: None  Mental Status: Normal mood and affect. Normal behavior. Normal judgment and thought content.   Assessment and Plan:  Pregnancy: [id number] at [redacted]w[redacted]d  Prenatal care, subsequent pregnancy, first trimester Assessment & Plan: Doing well. BP normal. TWG 0 lb. Normal anatomy US, discussed - anatomy unremarkable, anterior placenta, 3 vessel cord. No EFW %ile or fetal gender, will message radiologist for amendment.   Plan for delivery planning  appointment with [facility] between 32 and 34 weeks.   Orders: -     Prenatal Vitamin; Take 1 tablet by mouth daily.   Preterm labor symptoms and general obstetric precautions including but not limited to vaginal bleeding, contractions, leaking of fluid and fetal movement were reviewed in detail with the patient. Please refer to After Visit Summary for other counseling recommendations.  Return in about 2 weeks (around 05/29/2023) for Routine prenatal visit.  Future Appointments  Date Time Provider Department Center  05/29/2023 10:20 AM [facility] PROVIDER [facility] None   Clydene Fake, MD

## 2023-05-16 ENCOUNTER — Encounter: Payer: Self-pay | Admitting: Advanced Practice Midwife

## 2023-05-29 ENCOUNTER — Ambulatory Visit: Payer: Self-pay | Admitting: Advanced Practice Midwife

## 2023-05-29 VITALS — BP 98/58 | HR 104 | Temp 97.6°F | Wt 127.6 lb

## 2023-05-29 DIAGNOSIS — Z550 Illiteracy and low-level literacy: Secondary | ICD-10-CM

## 2023-05-29 DIAGNOSIS — Z3482 Encounter for supervision of other normal pregnancy, second trimester: Secondary | ICD-10-CM

## 2023-05-29 DIAGNOSIS — Z3481 Encounter for supervision of other normal pregnancy, first trimester: Secondary | ICD-10-CM

## 2023-05-29 DIAGNOSIS — Z98891 History of uterine scar from previous surgery: Secondary | ICD-10-CM

## 2023-05-29 NOTE — Progress Notes (Signed)
[facility] Health Department Maternal Health Clinic  PRENATAL VISIT NOTE  Subjective:  Angela Terry is a 32 y.o. [phone] at [redacted]w[redacted]d being seen today for ongoing prenatal care.  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  Patient reports no complaints.  Contractions: Not present. Vag. Bleeding: None.  Movement: Present. Denies leaking of fluid/ROM.   The following portions of the patient's history were reviewed and updated as appropriate: allergies, current medications, past family history, past medical history, past social history, past surgical history and problem list. Problem list updated.  Objective:   Vitals:   05/29/23 0906  BP: (!) 98/58  Pulse: (!) 104  Temp: 97.6 F (36.4 C)  Weight: 127 lb 9.6 oz (57.9 kg)    Fetal Status: Fetal Heart Rate (bpm): 145 Fundal Height: 21 cm Movement: Present     General:  Alert, oriented and cooperative. Patient is in no acute distress.  Skin: Skin is warm and dry. No rash noted.   Cardiovascular: Normal heart rate noted  Respiratory: Normal respiratory effort, no problems with respiration noted  Abdomen: Soft, gravid, appropriate for gestational age.  Pain/Pressure: Absent     Pelvic: Cervical exam deferred        Extremities: Normal range of motion.  Edema: None  Mental Status: Normal mood and affect. Normal behavior. Normal judgment and thought content.   Assessment and Plan:  Pregnancy: [id number] at [redacted]w[redacted]d  1. Prenatal care, subsequent pregnancy, first trimester (Primary) Not working Hasn't made dental apt yet (has never been to dentist); encouraged to schedule apt 3 lb 9.6 oz (1.633 kg) 3 lb wt gain in last 4 wks Walking 5x/wk x 20 min AFP neg 04/17/23 Reviewed 05/10/23 u/s at 18 4/7 with anterior placenta 3.9 cm from placental tip to  cx, 3VC  2. History of C-section x2 Needs repeat c/s  3. 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 Please refer to After Visit Summary for other counseling recommendations.  Return in about 4 weeks (around 06/26/2023) for routine PNC.  Future Appointments  Date Time Provider Department Center  05/29/2023 10:20 AM Naviyah Schaffert, Austin Miles, CNM [facility] None    Alberteen Spindle, CNM

## 2023-06-05 ENCOUNTER — Telehealth: Payer: Self-pay

## 2023-06-05 NOTE — Telephone Encounter (Signed)
TC to patient to inform her that [facility] has been trying to get in touch with her about a delivery plans appointment. Patient given number to call #[phone] and can ask for interpreter. Patient read number back and states she will call. Patient counseled to let us know if she has any trouble getting in touch with [facility]. Interpreter, L. Synetta Fail.Burt Knack, RN

## 2023-06-26 ENCOUNTER — Ambulatory Visit: Payer: Self-pay | Admitting: Family Medicine

## 2023-06-26 VITALS — BP 106/75 | HR 94 | Temp 97.3°F | Wt 128.4 lb

## 2023-06-26 DIAGNOSIS — Z3482 Encounter for supervision of other normal pregnancy, second trimester: Secondary | ICD-10-CM

## 2023-06-26 DIAGNOSIS — Z98891 History of uterine scar from previous surgery: Secondary | ICD-10-CM

## 2023-06-26 DIAGNOSIS — Z3481 Encounter for supervision of other normal pregnancy, first trimester: Secondary | ICD-10-CM

## 2023-06-26 DIAGNOSIS — Z3A25 25 weeks gestation of pregnancy: Secondary | ICD-10-CM

## 2023-06-26 NOTE — Progress Notes (Signed)
 [facility] M[facility] Health Clinic [address], [address] [place] [address] Main phone: [phone]  Prenatal Visit  Subjective:  Angela Terry is a 33 y.o. [phone] at [redacted]w[redacted]d being seen today for ongoing prenatal care.  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   .  .   . Denies leaking of fluid/ROM.   The following portions of the patient's history were reviewed and updated as appropriate: allergies, current medications, past family history, past medical history, past social history, past surgical history and problem list. Problem list updated.  Objective:   Vitals:   06/26/23 0853  BP: 106/75  Pulse: 94  Temp: (!) 97.3 F (36.3 C)  Weight: 128 lb 6.4 oz (58.2 kg)    Fetal Status:           General:  Alert, oriented and cooperative. Patient is in no acute distress.  Skin: Skin is warm and dry. No rash noted.   Cardiovascular: Normal heart rate noted  Respiratory: Normal respiratory effort, no problems with respiration noted  Abdomen: Soft, gravid, appropriate for gestational age.        Pelvic: Cervical exam deferred        Extremities: Normal range of motion.     Mental Status: Normal mood and affect. Normal behavior. Normal judgment and thought content.   Assessment and Plan:  Pregnancy: [id number] at [redacted]w[redacted]d  1. Prenatal care, subsequent pregnancy, first trimester (Primary) -taking PNV daily 4 lb 6.4 oz (1.996 kg) -reports poor appetite- reports down mood, sleeps well, but states that she is bothered by her poor appetite -encouraged to see counselor, like Alan, LCSW- declined for now -pt wants to wait and talk about it at next appt-  reports she has a good support system- and supportive partner that she talks to  2. History of C-section x2 -[facility] has been trying to contact patient regarding scheduling for delivery plans appt- RN to call and verify date  3. [redacted] weeks gestation of pregnancy -RTC in 3 weeks for 28 week labs and RV   Preterm labor symptoms and general obstetric precautions including but not limited to vaginal bleeding, contractions, leaking of fluid and fetal movement were reviewed in detail with the patient. Please refer to After Visit Summary for other counseling recommendations.  No follow-ups on file.  No future appointments. Due to language barrier, interpreter Marlene Norway was present for this visit.  Verneta Bers, [place]

## 2023-06-26 NOTE — Progress Notes (Signed)
 Here for MH RV at 25 5/7. S/S PTL reviewed with client and literature given. TC to [facility] with patient today.Burt Knack, RN

## 2023-07-17 ENCOUNTER — Ambulatory Visit: Payer: Self-pay | Admitting: Advanced Practice Midwife

## 2023-07-17 VITALS — Wt 128.1 lb

## 2023-07-17 DIAGNOSIS — Z3483 Encounter for supervision of other normal pregnancy, third trimester: Secondary | ICD-10-CM

## 2023-07-17 DIAGNOSIS — Z3481 Encounter for supervision of other normal pregnancy, first trimester: Secondary | ICD-10-CM

## 2023-07-17 DIAGNOSIS — Z98891 History of uterine scar from previous surgery: Secondary | ICD-10-CM

## 2023-07-17 DIAGNOSIS — Z550 Illiteracy and low-level literacy: Secondary | ICD-10-CM

## 2023-07-17 LAB — HEMOGLOBIN, FINGERSTICK: Hemoglobin: 12 g/dL (ref 11.1–15.9)

## 2023-07-17 NOTE — Progress Notes (Signed)
In house hgb result reviewed during visit. BTHIELE RN

## 2023-07-17 NOTE — Progress Notes (Signed)
  [facility] [address] [place] [address] Main phone: [phone]  Prenatal Visit  Subjective:  Angela Terry is a 33 y.o. [phone] at [redacted]w[redacted]d being seen today for ongoing prenatal care.  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  Contractions: Not present. Vag. Bleeding: None.  Movement: Present. Denies leaking of fluid/ROM.   The following portions of the patient's history were reviewed and updated as appropriate: allergies, current medications, past family history, past medical history, past social history, past surgical history and problem list. Problem list updated.  Objective:   Vitals:   07/17/23 1052  Weight: 128 lb 2 oz (58.1 kg)    Fetal Status: Fetal Heart Rate (bpm): 130 Fundal Height: 29 cm Movement: Present     General:  Alert, oriented and cooperative. Patient is in no acute distress.  Skin: Skin is warm and dry. No rash noted.   Cardiovascular: Normal heart rate noted  Respiratory: Normal respiratory effort, no problems with respiration noted  Abdomen: Soft, gravid, appropriate for gestational age.  Pain/Pressure: Absent     Pelvic: Cervical exam deferred        Extremities: Normal range of motion.  Edema: None  Mental Status: Normal mood and affect. Normal behavior. Normal judgment and thought content.   Assessment and Plan:  Pregnancy: [id number] at [redacted]w[redacted]d  1. Prenatal care, subsequent pregnancy, first trimester (Primary) Here with 60 yo daughter 4 lb 2 oz (1.871 kg) 0 wt gain in last 3 wks 1 hour glucola today Not working Hasn't made dental apt yet; encouraged to do so ASAP Walking 2-3x/wk x 15-20  min States doesn't feel she needs counseling with Kathreen Cosier because she is feeling better with better energy level and appetite Signs and symptoms of preeclampsia were verbally reviewed with warning signs, when and how to call. A written handout with tips on how to take your blood pressure, as well as warning signs was provided to the patient.    - Glucose, 1 hour gestational - HIV-1/HIV-2 Qualitative RNA - RPR - Hemoglobin, venipuncture  2. History of C-section x2 Has delivery plans apt 08/08/23 with [facility]  3. 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 Please refer to After Visit Summary for other counseling recommendations.  Return in about 2 weeks (around 07/31/2023) for routine PNC.  No future appointments. Due to language barrier, a Spanish interpreter Aaron Mose.) was present in person during the history-taking, subsequent discussion, and physical exam with this patient.     Alberteen Spindle, CNM

## 2023-07-19 ENCOUNTER — Telehealth: Payer: Self-pay

## 2023-07-19 DIAGNOSIS — O99810 Abnormal glucose complicating pregnancy: Secondary | ICD-10-CM | POA: Insufficient documentation

## 2023-07-19 LAB — RPR: RPR Ser Ql: NONREACTIVE

## 2023-07-19 LAB — GLUCOSE, 1 HOUR GESTATIONAL: Gestational Diabetes Screen: 166 mg/dL — ABNORMAL HIGH (ref 70–139)

## 2023-07-19 LAB — HIV-1/HIV-2 QUALITATIVE RNA
HIV-1 RNA, Qualitative: NONREACTIVE
HIV-2 RNA, Qualitative: NONREACTIVE

## 2023-07-19 NOTE — Telephone Encounter (Signed)
 TC to patient to inform of elevated 1 hour gtt (166). Patient scheduled for 3 hour gtt and states understanding about fasting instructions. Scheduled for 07/24/23. [address] [place], [place] # [id number].Rosiland Cooks, RN

## 2023-07-24 ENCOUNTER — Ambulatory Visit: Payer: Self-pay | Admitting: Advanced Practice Midwife

## 2023-07-24 DIAGNOSIS — O99810 Abnormal glucose complicating pregnancy: Secondary | ICD-10-CM

## 2023-07-24 NOTE — Progress Notes (Signed)
 Client presented to [facility] for 3 hour GTT only (no available appt in Nurse Clinic for when client desires to come for testing). Per client, last oral intake (food and liquid at 2100 07/23/23). Aware may have small sips of plain water during testing this am. Counseled to remain at [facility] during testing and aware to return to lab for blood draw at times on paper slip lab will give her. Counseled to return to [facility] for problems / questions. Client verbalized understanding of above. Ariel Begun, RN

## 2023-07-25 ENCOUNTER — Encounter: Payer: Self-pay | Admitting: Advanced Practice Midwife

## 2023-07-25 ENCOUNTER — Telehealth: Payer: Self-pay

## 2023-07-25 ENCOUNTER — Other Ambulatory Visit: Payer: Self-pay | Admitting: Advanced Practice Midwife

## 2023-07-25 DIAGNOSIS — O24419 Gestational diabetes mellitus in pregnancy, unspecified control: Secondary | ICD-10-CM | POA: Insufficient documentation

## 2023-07-25 LAB — GESTATIONAL GLUCOSE TOLERANCE
Glucose, Fasting: 78 mg/dL (ref 70–94)
Glucose, GTT - 1 Hour: 205 mg/dL — ABNORMAL HIGH (ref 70–179)
Glucose, GTT - 2 Hour: 172 mg/dL — ABNORMAL HIGH (ref 70–154)
Glucose, GTT - 3 Hour: 127 mg/dL (ref 70–139)

## 2023-07-25 MED ORDER — BLOOD GLUCOSE TEST VI STRP: 100.0000 | ORAL_STRIP | Freq: Four times a day (QID) | Status: DC

## 2023-07-25 MED ORDER — LANCET DEVICE MISC: 1.0000 | Freq: Three times a day (TID) | 0 refills | Status: AC

## 2023-07-25 MED ORDER — LANCETS MISC: 1.0000 [IU] | Freq: Four times a day (QID) | 12 refills | Status: DC

## 2023-07-25 MED ORDER — LANCETS MISC. MISC: 1.0000 | Freq: Three times a day (TID) | 0 refills | Status: AC

## 2023-07-25 MED ORDER — BLOOD GLUCOSE TEST VI STRP: 1.0000 | ORAL_STRIP | Freq: Three times a day (TID) | 0 refills | Status: AC

## 2023-07-25 MED ORDER — BLOOD GLUCOSE MONITORING SUPPL DEVI: 1.0000 | Freq: Three times a day (TID) | 0 refills | Status: DC

## 2023-07-25 NOTE — Telephone Encounter (Signed)
Call to client per [facility] CNM result note and counseled regarding new GDM diagnosis. Client counseled [facility] near the hospital will be calling her with an appt for diabetic education. Also counseled regarding availability of no charge diabetic supply kit and states will probably pick up tomorrow or Thursday. Client asking about her diet and counseled [facility] will educate her regarding diet. Encouraged to avoid sodas, sweet tea and sweet desserts until has [facility] appt. Also counseled to eat tortillas, beans and fruit in moderation until can discuss with [facility]. Client verbalized understanding. Jossie Ng, RN

## 2023-07-25 NOTE — Telephone Encounter (Signed)
[employer] ID # [id number] assisted with call. Jossie Ng, RN

## 2023-07-31 ENCOUNTER — Encounter: Payer: Self-pay | Admitting: Family Medicine

## 2023-07-31 ENCOUNTER — Ambulatory Visit: Payer: Self-pay | Admitting: Family Medicine

## 2023-07-31 VITALS — BP 101/67 | HR 78 | Temp 98.3°F | Wt 126.8 lb

## 2023-07-31 DIAGNOSIS — Z3483 Encounter for supervision of other normal pregnancy, third trimester: Secondary | ICD-10-CM

## 2023-07-31 DIAGNOSIS — Z98891 History of uterine scar from previous surgery: Secondary | ICD-10-CM

## 2023-07-31 DIAGNOSIS — Z3A30 30 weeks gestation of pregnancy: Secondary | ICD-10-CM

## 2023-07-31 DIAGNOSIS — Z3481 Encounter for supervision of other normal pregnancy, first trimester: Secondary | ICD-10-CM

## 2023-07-31 DIAGNOSIS — O24419 Gestational diabetes mellitus in pregnancy, unspecified control: Secondary | ICD-10-CM

## 2023-07-31 LAB — URINALYSIS
Bilirubin, UA: NEGATIVE
Glucose, UA: NEGATIVE
Nitrite, UA: NEGATIVE
Protein,UA: NEGATIVE
RBC, UA: NEGATIVE
Specific Gravity, UA: 1.025 (ref 1.005–1.030)
Urobilinogen, Ur: 0.2 mg/dL (ref 0.2–1.0)
pH, UA: 6 (ref 5.0–7.5)

## 2023-07-31 MED ORDER — PRENATAL VITAMIN 27-0.8 MG PO TABS
1.0000 | ORAL_TABLET | Freq: Every day | ORAL | Status: AC
Start: 2023-07-31 — End: ?

## 2023-07-31 NOTE — Progress Notes (Signed)
 TC to [facility], next available appointment is 09/04/23. Patient to see MNT in [facility] at 08/07/23 appointment. [facility] U/S scheduled for 08/23/23 in [place]. [place] Financial assistance application given to client today. TC to [employer] in [facility], who states she can see client next Monday and will try to call client. Burt Knack, RN

## 2023-07-31 NOTE — Progress Notes (Signed)
 Patient here for MH RV at 30 5/7. Patient now scheduled for [facility] appointment on 09/04/23, the earliest appointment available. Per provider, she will discuss nutrition with client and client will bring glucometer supplies to clinic next week at her appointment and learn how to use the glucometer. Patient aware of [facility] delivery plans appointment and to take $60 with her to that appointment. Urine dip reviewed by provider.Burt Knack, RN

## 2023-07-31 NOTE — Progress Notes (Addendum)
 [facility] [address] [place] [address] Main phone: [phone]  Prenatal Visit  Subjective:  Angela Terry is a 33 y.o. [phone] at [redacted]w[redacted]d being seen today for ongoing prenatal care.  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  Contractions: Irregular. Vag. Bleeding: None.  Movement: Present. Denies leaking of fluid/ROM.   The following portions of the patient's history were reviewed and updated as appropriate: allergies, current medications, past family history, past medical history, past social history, past surgical history and problem list. Problem list updated.  Objective:  There were no vitals filed for this visit.  Fetal Status: Fetal Heart Rate (bpm): 135 Fundal Height: 31 cm Movement: Present     General:  Alert, oriented and cooperative. Patient is in no acute distress.  Skin: Skin is warm and dry. No rash noted.   Cardiovascular: Normal heart rate noted  Respiratory: Normal respiratory effort, no problems with respiration noted  Abdomen: Soft, gravid, appropriate for gestational age.  Pain/Pressure: Absent     Pelvic: Cervical exam deferred        Extremities: Normal range of motion.  Edema: None  Mental Status: Normal mood and affect. Normal behavior. Normal judgment and thought content.   Assessment and Plan:  Pregnancy: [id number] at [redacted]w[redacted]d  1. [redacted] weeks gestation of pregnancy  2. Prenatal care, subsequent pregnancy, first trimester (Primary) -taking PNV daily  3. Gestational diabetes mellitus (GDM) affecting pregnancy 07/24/23  @29  wks Encouraged continued daily exercise and diet modifications. Patient has not had [facility] yet and does not check BS yet.   Exercise: walks 20. Minutes- 4-5 times per week, increased for 3 x/ week Diet recall :   Breakfast = oatmeal                      Lunch = 3 tortillas, chicken broth                      Dinner = beets, ate a sandwich with ham, white bread, lettuce, mayo                      Snack = crackers, or an orange  Water: drinks about  6 bottles of water/day and night 4 lb 2 oz (1.871 kg) -weekly appts  -urine dip reviewed- trace leuks and 3+ ketones in urine ; reports she last ate this morning - oatmeal  -ordered growth [place] today for 32 weeks -reviewed diet changes, MNT ordered  4. History of C-section x2 -has delivery plans appt on 08/08/23 @ 9a   Preterm labor symptoms and general obstetric precautions including but not limited to vaginal bleeding, contractions, leaking of fluid and fetal movement were reviewed in detail with the patient. Please refer to After Visit Summary for other counseling recommendations.  Return in about 1 week (around 08/07/2023) for Routine Prenatal Care.  Future Appointments  Date Time Provider Department Center  08/07/2023  8:40 AM [facility] PROVIDER [facility] None  09/04/2023  1:00 PM Darrick Grinder, RD [facility] None   Due to language barrier, a Spanish interpreter Roddie Mc  N.) was present in person during the history-taking, subsequent discussion, and physical exam with this patient.    Lenice Llamas, [place]

## 2023-08-07 ENCOUNTER — Ambulatory Visit: Payer: Self-pay | Admitting: Family Medicine

## 2023-08-07 VITALS — BP 92/59 | HR 90 | Temp 98.5°F | Wt 127.0 lb

## 2023-08-07 DIAGNOSIS — Z3481 Encounter for supervision of other normal pregnancy, first trimester: Secondary | ICD-10-CM

## 2023-08-07 DIAGNOSIS — Z98891 History of uterine scar from previous surgery: Secondary | ICD-10-CM

## 2023-08-07 DIAGNOSIS — Z3483 Encounter for supervision of other normal pregnancy, third trimester: Secondary | ICD-10-CM

## 2023-08-07 DIAGNOSIS — O24419 Gestational diabetes mellitus in pregnancy, unspecified control: Secondary | ICD-10-CM

## 2023-08-07 LAB — URINALYSIS
Bilirubin, UA: NEGATIVE
Glucose, UA: NEGATIVE
Leukocytes,UA: NEGATIVE
Nitrite, UA: NEGATIVE
Protein,UA: NEGATIVE
RBC, UA: NEGATIVE
Specific Gravity, UA: 1.025 (ref 1.005–1.030)
Urobilinogen, Ur: 0.2 mg/dL (ref 0.2–1.0)
pH, UA: 6 (ref 5.0–7.5)

## 2023-08-07 NOTE — Assessment & Plan Note (Signed)
 Has not yet started checking blood sugars. Brought kit today, RN to educate. Patient to check glucose 4 times daily: AM fasting and one hour after three meals. Return in 2 weeks for prenatal care, patient to bring glucose log.   Seeing [facility] nutrition[facility] after prenatal appointment today.   [facility] appointment scheduled for 3/24, the soonest they could book her.

## 2023-08-07 NOTE — Progress Notes (Signed)
 Urine dip reviewed with provider, no new orders. Patient walked down to [facility] and signed in to see nutritionist..Marland KitchenMarland KitchenBurt Knack, RN

## 2023-08-07 NOTE — Progress Notes (Signed)
 Patient here for MH RV at 31 5/7. Aware of delivery plans appt tomorrow and to take $60. Aware of U/S at [facility] in [place] on 08/23/23. Will go to see nutritionist after this appointment. Brought diabetic testing supplies and needs to learn how to use them. Has [facility] appointment 09/04/23. Needs urine dip today.Burt Knack, RN

## 2023-08-07 NOTE — Assessment & Plan Note (Addendum)
 Doing well. BP normal at 92/59. TWG 3 lb (1.361 kg). Taking prenatal vitamin daily. Next prenatal appointment in 2 weeks.   Discussed: - Mood resources, including 988, Maternal Mental Health Line - Prenatal classes, see AVS for more - Pain control during delivery: depends on delivery method (CS vs TOLAC) - Support person during delivery: Partner Obdulo Mauricio - Anticipated contraception: hormonal injection vs tubal ligation - Pre-eclampsia warning signs: Signs and symptoms of preeclampsia were verbally reviewed with warning signs, when and how to call. A written handout with tips on how to take your blood pressure, as well as warning signs was provided to the patient.

## 2023-08-07 NOTE — Patient Instructions (Addendum)
 I translated the following text using Google translate.  Please excuse any errors.  He traducido el siguiente texto con el traductor de Microbiologist. Disculpe cualquier error.  Gestational diabetes Please take your blood sugar measurement four times per day:  - Fasting, or first thing in the morning before eating or drinking  - One hour after breakfast - One hour after lunch - One hour after dinner Diabetes gestacional Mida su nivel de azcar en sangre cuatro veces al da: - Minerva Areola o a primera hora de la maana antes de comer o beber - Neomia Dear hora despus del desayuno - Una hora despus del almuerzo - Una hora despus de la cena  Pregnancy Continue taking your prenatal vitamin daily.  Please schedule your next prenatal visit for about 2 weeks from now.  Embarazo Contine tomando sus vitamina prenatal diaria. Programe su prxima visita prenatal para dentro de aproximadamente 2 [address].  Prenatal Classes If delivering at [facility] with [facility] or [facility]: Go to [email or web address]   If delivering at [facility]: Go to [email or web address].[facility].[email or web address]/ and search for "Pregnancy and Parenting Classes" "Prepared Childbirth Classes" Clases prenatales Si el parto se realizar en [facility] con el OB de la [facility] o en [facility]: Visite [email or web address]  Si el parto se realizar en la [facility]: visite [email or web address].[facility].[email or web address]/ y busque: "Clases de embarazo y crianza" "Clases de preparacin para el parto"  Resource regarding exposures that could affect pregnancy: Mother To [facility] is a Dentist with lots of information on the effects of many medications and exposures on your pregnancy. It is free to use, including their web site, phone line, text service, app, or email and live chat.  http://golden-thomas.org/ Email or live chat:  MotherToBaby.org Phone: [phone] Text: [phone] App: search "LactRx"  Recursos sobre exposiciones que podran afectar el embarazo: Mother To Baby es una organizacin sin fines de Visual merchandiser con mucha informacin [employer] efectos de muchos medicamentos y exposiciones en Firefighter. Su uso es gratuito, incluido su sitio web, Art therapist, servicio de mensajes de texto, aplicacin o correo electrnico y chat en vivo. CDApps.pl Correo electrnico o chat en vivo: MotherToBaby.org Telfono: [phone] Mensaje de texto: [phone] Aplicacin: busque "LactRx"  Maternal Mental Health If you start to develop the below symptoms of depression, please reach out to [place] for an appointment. There is also a Biomedical scientist Health Hotline at 7753298032 [phone]). This hotline has trained counselors, doulas, and midwifes to real-time support, information, and resources.  Feeling sad or hopeless most of the time Lack of interest in things you used to enjoy Less interest in caring for yourself (dressing, fixing hair) Trouble concentrating Trouble coping with daily tasks Constant worry about your baby Sleeping or eating too much or too little Feeling very anxious or nervous Unexplained irritability or anger Unwanted or scary thoughts Feeling that you are not a good mother Thoughts of hurting yourself or your baby If you feel you are experiencing a mental health crisis, please reach out to the National Suicide Prevention Hotline at 1-800-273-TALK 540-468-1170).  Salud Mental Materna Si comienza a Environmental education officer los siguientes sntomas de depresin, comunquese con nosotros para programar una cita. Tambin hay una lnea directa nacional de salud mental materna en el 1-833-9-HELP4MOMS [phone]). Esta lnea directa ha capacitado a consejeros, doulas y parteras para brindar apoyo, informacin y Occupational hygienist real.  Sentirse triste o  sin esperanza la mayor parte del tiempo  Falta de inters en las cosas que  sola disfrutar  Menos inters en cuidarse a s mismo (vestirse, arreglarse el cabello)  Problemas para concentrarse  Problemas para hacer frente a las tareas diarias  Preocupacin constante por su beb  Dormir o Microbiologist o muy poco  Sentirse muy ansioso o nervioso  Irritabilidad o ira inexplicables  Pensamientos no deseados o Insurance underwriter que no eres una buena madre  Pensamientos de Runner, broadcasting/film/video a s Visual merchandiser o a su beb Si cree que est experimentando una crisis de salud mental, comunquese con la [employer] de Prevencin del Suicidio al 1-800-273-TALK [phone]).

## 2023-08-07 NOTE — Progress Notes (Signed)
 [facility] [address] [place] [address] Main phone: [phone]  Prenatal Visit  Due to language barrier, a Spanish interpreter Rachel Bo T.) was present in person during the history-taking, subsequent discussion, and physical exam with this patient.   Subjective:  Angela Terry is a 33 y.o. [phone] at [redacted]w[redacted]d being seen today for ongoing prenatal care.  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  Contractions: Not present. Vag. Bleeding: None.  Movement: Present. Denies leaking of fluid/ROM.   The following portions of the patient's history were reviewed and updated as appropriate: allergies, current medications, past family history, past medical history, past social history, past surgical history and problem list. Problem list updated.  Objective:   Vitals:   08/07/23 0850  BP: (!) 92/59  Pulse: 90  Temp: 98.5 F (36.9 C)  Weight: 127 lb (57.6 kg)   Fetal Status: Fetal Heart Rate (bpm): 158 Fundal Height: 30 cm Movement: Present     General:  Alert, oriented and cooperative. Patient is in no acute distress.  Skin: Skin is warm and dry. No rash noted.   Cardiovascular: Normal heart rate noted  Respiratory: Normal respiratory effort, no problems with respiration noted  Abdomen: Soft, gravid, appropriate for gestational age.  Pain/Pressure: Present (Intermittent, at epigastrum and pubic symphisis)     Pelvic: Cervical exam deferred        Extremities: Normal range of motion.     Mental Status: Normal  mood and affect. Normal behavior. Normal judgment and thought content.   Assessment and Plan:  Pregnancy: [id number] at [redacted]w[redacted]d  Prenatal care, subsequent pregnancy, first trimester Assessment & Plan: Doing well. BP normal at 92/59. TWG 3 lb (1.361 kg). Taking prenatal vitamin daily. Next prenatal appointment in 2 weeks.   Discussed: - Mood resources, including 988, Maternal Mental Health Line - Prenatal classes, see AVS for more - Pain control during delivery: depends on delivery method (CS vs TOLAC) - Support person during delivery: Partner Obdulo Mauricio - Anticipated contraception: hormonal injection vs tubal ligation - Pre-eclampsia warning signs: Signs and symptoms of preeclampsia were verbally reviewed with warning signs, when and how to call. A written handout with tips on how to take your blood pressure, as well as warning signs was provided to the patient.     Gestational diabetes mellitus (GDM) affecting pregnancy Assessment & Plan: Has not yet started checking blood sugars. Brought kit today, RN to educate. Patient to check glucose 4 times daily: AM fasting and one hour after three meals. Return in 2 weeks for prenatal care, patient to bring glucose log.   Seeing [facility] nutritionist after prenatal appointment today.   [facility] appointment scheduled for 3/24, the soonest they could book her.   Orders: -     Urinalysis  History of C-section x2 Assessment & Plan: Has delivery plans appointment tomorrow 2/25 to discuss CS vs TOLAC and also possible tubal ligation.     Preterm labor symptoms and general obstetric precautions including but not limited to vaginal bleeding, contractions, leaking of fluid and fetal movement were reviewed in detail with  the patient. Please refer to After Visit Summary for other counseling recommendations.  No follow-ups on file.  Future Appointments  Date Time Provider Department Center  08/14/2023 10:20 AM [facility] PROVIDER AC-MAT None  08/23/2023   8:15 AM [facility] NURSE [facility]  08/23/2023  8:30 AM [facility] US5 [facility]  08/23/2023  9:15 AM [facility] PROVIDER 1 [facility]  09/04/2023  1:00 PM Darrick Grinder, RD [facility] None   Clydene Fake, MD

## 2023-08-07 NOTE — Assessment & Plan Note (Signed)
 Has delivery plans appointment tomorrow 2/25 to discuss CS vs TOLAC and also possible tubal ligation.

## 2023-08-14 ENCOUNTER — Encounter: Payer: Self-pay | Admitting: Family Medicine

## 2023-08-14 ENCOUNTER — Ambulatory Visit: Payer: Self-pay | Admitting: Family Medicine

## 2023-08-14 VITALS — BP 97/59 | HR 99 | Temp 97.1°F | Wt 126.2 lb

## 2023-08-14 DIAGNOSIS — Z98891 History of uterine scar from previous surgery: Secondary | ICD-10-CM

## 2023-08-14 DIAGNOSIS — Z3481 Encounter for supervision of other normal pregnancy, first trimester: Secondary | ICD-10-CM

## 2023-08-14 DIAGNOSIS — Z3A32 32 weeks gestation of pregnancy: Secondary | ICD-10-CM

## 2023-08-14 DIAGNOSIS — O24419 Gestational diabetes mellitus in pregnancy, unspecified control: Secondary | ICD-10-CM

## 2023-08-14 DIAGNOSIS — Z3483 Encounter for supervision of other normal pregnancy, third trimester: Secondary | ICD-10-CM

## 2023-08-14 LAB — URINALYSIS
Bilirubin, UA: NEGATIVE
Glucose, UA: NEGATIVE
Leukocytes,UA: NEGATIVE
Nitrite, UA: NEGATIVE
Protein,UA: NEGATIVE
RBC, UA: NEGATIVE
Specific Gravity, UA: 1.02 (ref 1.005–1.030)
Urobilinogen, Ur: 0.2 mg/dL (ref 0.2–1.0)
pH, UA: 7 (ref 5.0–7.5)

## 2023-08-14 NOTE — Progress Notes (Signed)
 Patient here for MH RV at 32 5/7. BS log copied, urine dip today. Had delivery plans appointment with [facility] and kept nutritionist appointment at [facility].Marland KitchenBurt Knack, RN

## 2023-08-14 NOTE — Progress Notes (Signed)
 [facility] [address] [place] [address] Main phone: [phone]  Prenatal Visit  Subjective:  Angela Terry is a 33 y.o. [phone] at [redacted]w[redacted]d being seen today for ongoing prenatal care.  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  Contractions: Not present. Vag. Bleeding: None.  Movement: Present. Denies leaking of fluid/ROM.   The following portions of the patient's history were reviewed and updated as appropriate: allergies, current medications, past family history, past medical history, past social history, past surgical history and problem list. Problem list updated.  Objective:   Vitals:   08/14/23 1102  BP: (!) 97/59  Pulse: 99  Temp: (!) 97.1 F (36.2 C)  Weight: 126 lb 3.2 oz (57.2 kg)    Fetal Status: Fetal Heart Rate (bpm): 143 Fundal Height: 32 cm Movement: Present     General:  Alert, oriented and cooperative. Patient is in no acute distress.  Skin: Skin is warm and dry. No rash noted.   Cardiovascular: Normal heart rate noted  Respiratory: Normal respiratory effort, no problems with respiration noted  Abdomen: Soft, gravid, appropriate for gestational age.  Pain/Pressure: Present     Pelvic: Cervical exam deferred        Extremities: Normal range of motion.  Edema: None  Mental Status: Normal mood and affect. Normal behavior. Normal judgment and thought content.   Assessment and Plan:  Pregnancy: [id number] at [redacted]w[redacted]d  1. Gestational diabetes mellitus (GDM) affecting pregnancy 07/24/23 @29  wks (Primary) Blood sugar log  values below. Encouraged continued daily exercise and diet modifications. 3 of  8 FBS values are abnormal (range was 61 to 101) 4 of 21 2hr pp values are abnormal (range was 103 to 158) Exercise: walks 20. minutes per day Diet recall :   Breakfast = white bread, coffee with 1% milk, vegetables with chicken                      Lunch = scrambled eggs with hot dogs, and whole grain bread                       Dinner = chicken with vegetables                       Snack = broccoli, green vegetables (squash)  Water: drinks about  5 bottles of water/day and night  -UA reviewed- no glucose, 3+ ketones -reviewed dietary changes- encouraged to make notes on blood sugar log to evaluate how different foods affect her body  -has appt w MFM for growth [place] on 08/23/23 -appt w lifestyles is on 09/04/23  - Urinalysis  2. Prenatal care, subsequent pregnancy, first trimester -taking PNV vitamins daily 2 lb 3.2 oz (0.998 kg) -reports some round ligament pain- reviewed what this is as well as comfort suggestions given  3. History of C-section x2 -had delivery plans appt -delivery scheduled for 09/29/23  4. [redacted] weeks gestation of pregnancy -RTC in 1 week   Preterm labor symptoms and general obstetric precautions including but not limited to vaginal bleeding,  contractions, leaking of fluid and fetal movement were reviewed in detail with the patient. Please refer to After Visit Summary for other counseling recommendations.  Return in about 1 week (around 08/21/2023) for Routine Prenatal Care.  Future Appointments  Date Time Provider Department Center  08/21/2023  2:00 PM [facility] PROVIDER [facility] None  08/23/2023  8:15 AM [facility] NURSE [facility]  08/23/2023  8:30 AM [facility] US5 [facility]  08/23/2023  9:15 AM [facility] PROVIDER 1 [facility]  09/04/2023  1:00 PM Darrick Grinder, RD [facility] None  09/26/2023  8:00 AM [facility] PAT2 [facility] None    Lenice Llamas, FNP

## 2023-08-21 ENCOUNTER — Encounter: Payer: Self-pay | Admitting: Family Medicine

## 2023-08-21 ENCOUNTER — Ambulatory Visit: Payer: Self-pay | Admitting: Family Medicine

## 2023-08-21 VITALS — BP 96/64 | HR 86 | Temp 97.4°F | Wt 126.2 lb

## 2023-08-21 DIAGNOSIS — O24419 Gestational diabetes mellitus in pregnancy, unspecified control: Secondary | ICD-10-CM

## 2023-08-21 DIAGNOSIS — Z3A33 33 weeks gestation of pregnancy: Secondary | ICD-10-CM

## 2023-08-21 DIAGNOSIS — Z3481 Encounter for supervision of other normal pregnancy, first trimester: Secondary | ICD-10-CM

## 2023-08-21 DIAGNOSIS — Z3483 Encounter for supervision of other normal pregnancy, third trimester: Secondary | ICD-10-CM

## 2023-08-21 DIAGNOSIS — Z98891 History of uterine scar from previous surgery: Secondary | ICD-10-CM

## 2023-08-21 LAB — URINALYSIS
Bilirubin, UA: NEGATIVE
Glucose, UA: NEGATIVE
Ketones, UA: NEGATIVE
Nitrite, UA: NEGATIVE
Protein,UA: NEGATIVE
RBC, UA: NEGATIVE
Specific Gravity, UA: 1.015 (ref 1.005–1.030)
Urobilinogen, Ur: 0.2 mg/dL (ref 0.2–1.0)
pH, UA: 7 (ref 5.0–7.5)

## 2023-08-21 NOTE — Progress Notes (Signed)
 [employer] utilized. Patient states performing blood sugars and kick counts however left documentation at home. Urine Dip results reviewed during visit. Reminded of upcoming appointments. Delynn Flavin RN

## 2023-08-21 NOTE — Progress Notes (Signed)
 [facility] [address], [address] [place] [address] Main phone: [phone]  Prenatal Visit  Subjective:  Angela Terry is a 33 y.o. [phone] at [redacted]w[redacted]d being seen today for ongoing prenatal care.  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  Contractions: Irregular. Vag. Bleeding: None.  Movement: Present. Denies leaking of fluid/ROM.   The following portions of the patient's history were reviewed and updated as appropriate: allergies, current medications, past family history, past medical history, past social history, past surgical history and problem list. Problem list updated.  Objective:   Vitals:   08/21/23 1410  BP: 96/64  Pulse: 86  Temp: (!) 97.4 F (36.3 C)  Weight: 126 lb 3.2 oz (57.2 kg)    Fetal Status: Fetal Heart Rate (bpm): 140 Fundal Height: 33 cm Movement: Present     General:  Alert, oriented and cooperative. Patient is in no acute distress.  Skin: Skin is warm and dry. No rash noted.   Cardiovascular: Normal heart rate noted  Respiratory: Normal respiratory effort, no problems with respiration noted  Abdomen: Soft, gravid, appropriate for gestational age.  Pain/Pressure: Present     Pelvic: Cervical exam deferred        Extremities: Normal range of motion.  Edema: None  Mental Status: Normal mood and affect. Normal behavior. Normal judgment and thought content.   Assessment and Plan:  Pregnancy: [id number] at [redacted]w[redacted]d  1. [redacted] weeks gestation of pregnancy   2. Gestational diabetes mellitus (GDM) affecting pregnancy 07/24/23 @29   wks Blood sugar log values below. Encouraged continued daily exercise and diet modifications. Patient forgot blood sugar log today. Has a photo on her phone 3/8 FBS elevated. Range is 75-99 3/20 post prandial elevated. Range is 92-160   Exercise: walks 20. Minutes/ 4x/week  Diet recall :   Breakfast = eggs                       Lunch = tortillas with chicken, slaw and cactus                       Dinner = roasted chicken with two tortillas with shredded chicken and greens                      Snack = can't remember   Water: drinks about  5 bottles of water/day and night 2 lb 3.2 oz (0.998 kg) -UA reviewed: negative for glucose  -has [place] appt for 08/23/23  3. Prenatal care, subsequent pregnancy, first trimester (Primary) -taking PNV daily -given stretching sheet today - no other concerns   4. History of C-section x2 -delivery plans appt done 08/08/23  -has c/s scheduled for 09/29/23    Preterm labor symptoms and general obstetric precautions including but not limited to vaginal bleeding, contractions, leaking of fluid and fetal movement were reviewed in detail with the patient. Please refer to After Visit Summary for other counseling recommendations.  Return in about 1 week (around 08/28/2023) for Routine Prenatal Care.  Future Appointments  Date Time Provider Department Center  08/23/2023  8:15 AM  [facility] NURSE [facility]  08/23/2023  8:30 AM [facility] US5 [facility]  08/23/2023  9:15 AM [facility] PROVIDER 1 [facility]  09/04/2023  1:00 PM Darrick Grinder, RD [facility] None  09/26/2023  8:00 AM [facility] PAT2 [facility] None   Due to language barrier, a Spanish interpreter Rachel Bo T.) was present in person during the history-taking, subsequent discussion, and physical exam with this patient.   Lenice Llamas, [place]

## 2023-08-23 ENCOUNTER — Other Ambulatory Visit: Payer: Self-pay

## 2023-08-23 ENCOUNTER — Ambulatory Visit: Payer: Self-pay | Admitting: Obstetrics and Gynecology

## 2023-08-23 ENCOUNTER — Ambulatory Visit: Payer: Self-pay

## 2023-08-23 ENCOUNTER — Encounter: Payer: Self-pay | Admitting: *Deleted

## 2023-08-23 ENCOUNTER — Other Ambulatory Visit: Payer: Self-pay | Admitting: Family Medicine

## 2023-08-23 ENCOUNTER — Ambulatory Visit: Payer: Self-pay | Attending: Family Medicine

## 2023-08-23 VITALS — BP 103/59 | HR 86

## 2023-08-23 DIAGNOSIS — O24410 Gestational diabetes mellitus in pregnancy, diet controlled: Secondary | ICD-10-CM

## 2023-08-23 DIAGNOSIS — O24419 Gestational diabetes mellitus in pregnancy, unspecified control: Secondary | ICD-10-CM | POA: Insufficient documentation

## 2023-08-23 DIAGNOSIS — O34219 Maternal care for unspecified type scar from previous cesarean delivery: Secondary | ICD-10-CM

## 2023-08-23 DIAGNOSIS — Z3A34 34 weeks gestation of pregnancy: Secondary | ICD-10-CM

## 2023-08-23 NOTE — Progress Notes (Signed)
 Maternal-Fetal Medicine Consultation I had the pleasure of [name]. She is G4 P2012 at 34-weeks' gestation and is here for fetal anatomy scan.  I obtained history and counseled the patient with the help of Spanish language interpreter present in the room.  Patient has a recent diagnosis of gestational diabetes and checks her blood glucose regularly.  She reports her fasting levels range between 91 and 100 mg/dL postprandial levels range between 103-158 mg/dL.  Obstetric history is significant for 2 term cesarean deliveries.  She had a first a cesarean delivery in [place] and the second at [facility].  She has a vertical skin scar.  Ultrasound Fetal growth is appropriate for gestational age.  Amniotic fluid is normal and good fetal activity seen.  Fetal anatomical survey appears normal but limited by advanced gestational age.  Antenatal testing is reassuring.  BPP 8/8.  Gestational diabetes I explained the diagnosis of gestational diabetes.  I emphasized the importance of good blood glucose control to prevent adverse fetal or neonatal outcomes.  I discussed normal range of blood glucose values. I encouraged her to check her blood glucose regularly.  Possible complications of gestational diabetes include fetal macrosomia, shoulder dystocia and birth injuries, stillbirth (in poorly controlled diabetes) and neonatal respiratory syndrome and other complications.  Based on blood glucose values, patient requires metformin or insulin. I discussed medical treatment and reassured her that metformin is safe in pregnancy.      Type 2 diabetes develops in up to 50% of women with GDM. I recommend postpartum screening with 75-g glucose load at 6 to 12 weeks after delivery. Previous cesarean deliveries Her first cesarean section was performed in [place] and has a vertical skin scar.  If the type of uterine incision is not known, repeat cesarean  delivery should be performed. If for cesarean section involved low transverse incision, and the patient desires vaginal delivery, she should be counseled on VBAC.  I told her that the risk of uterine scar dehiscence after 2 cesarean deliveries she is about 2% provided she had low transverse cesarean sections.  Recommendations -Initiate metformin or insulin. -Weekly BPP or NST till delivery.  Patient prefers to have weekly antenatal testing at your office. Consultation including face-to-face (more than 50%) counseling 30 minutes.

## 2023-08-24 ENCOUNTER — Telehealth: Payer: Self-pay | Admitting: Family Medicine

## 2023-08-24 NOTE — Telephone Encounter (Signed)
 Called patient to discuss visit with MFM and GDM. Per MFM, recommendation is to start Metformin or insulin for diabetes management. Reviewed that it's important to bring her blood sugar log to her next appointment. Discussed with patient that we can go over both benefits and risks of each treatment when we see her on Monday. Reviewed that the US done yesterday was normal.  Due to language barrier, a Spanish interpreter [place], [place] [phone] ) was present by phone during the history-taking, subsequent discussion, and physical exam with this patient.   [facility] FNP-C

## 2023-08-25 NOTE — Progress Notes (Unsigned)
 [facility] [address] [place] [address] Main phone: [phone]  Prenatal Visit  Subjective:  Angela Terry is a 33 y.o. [phone] at [redacted]w[redacted]d being seen today for ongoing prenatal care.  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  Contractions: Not present. Vag. Bleeding: None.  Movement: Present. Denies leaking of fluid/ROM.   The following portions of the patient's history were reviewed and updated as appropriate: allergies, current medications, past family history, past medical history, past social history, past surgical history and problem list. Problem list updated.  Objective:   Vitals:   08/28/23 1549  BP: 99/67  Pulse: 93  Temp: 99.1 F (37.3 C)  Weight: 126 lb 6.4 oz (57.3 kg)    Fetal Status: Fetal Heart Rate (bpm): 145 Fundal Height: 34 cm Movement: Present     General:  Alert, oriented and cooperative. Patient is in no acute distress.  Skin: Skin is warm and dry. No rash noted.   Cardiovascular: Normal heart rate noted  Respiratory: Normal respiratory effort, no problems with respiration noted  Abdomen: Soft, gravid, appropriate for gestational age.  Pain/Pressure: Absent     Pelvic: Cervical exam deferred        Extremities: Normal range of motion.     Mental Status: Normal mood and affect. Normal behavior. Normal judgment and thought content.   Assessment and Plan:  Pregnancy: [id number] at 105w5d  1. [redacted] weeks gestation of pregnancy   2. Prenatal care, subsequent pregnancy, first trimester (Primary) -taking PNV  daily  3. Gestational diabetes mellitus (GDM) affecting pregnancy 07/24/23 @29  wks -seen by MFM on 08/23/22- recommendations are as follows:  -insulin vs metformin  -weekly BPP or NST in office (per documentation may prefer to have NST at [facility] with visits), will complete NST today  -BPP done on 08/23/22= 8/8, EFW was 19% -I reviewed with patient that while insulin is considered the first line choice for GDM management according to UpToDate, patients can find the frequent injections uncomfortable, and her care would need to be transferred to MFM  -patient does have a low level of literacy (finished 3rd grade), so I do have some concerns regarding being able to dose herself with insulin -metformin has been shown to also help GDM patients, and per MFM, is reasonable for this patient. This would also allow her to continue her care here at the [facility] -after viewing the risk/benefit of each treatment- shared decision making was used and patient would like to proceed with Metformin- prescribed to the [facility]- instructed to take 500mg  BID   Blood sugar log values below. Encouraged continued daily exercise and diet modifications. 1 of  7 FBS values are abnormal (range was 83 to 95) 3 of 19 2hr pp values are abnormal (range was 101 to 171) Exercise: walks 30. minutes 3-4 x /week Diet recall :   Breakfast = eggs with sausage and whole wheat bread                      Lunch = beef broth with carrots and veg  Dinner = chicken with broccoli, pumpkin, and cauliflower                      Snack = fruit/ whole wheat bread (only occasionally) Water: drinks about  5 bottles of water/day and night  UA reviewed- negative for glucose -NST reviewed with Dr. Larita Fife, reassuring- baby moved a lot during NST- difficulty getting a consistent tracing - but appears reassuring for GA  Preterm labor symptoms and general obstetric precautions including but not limited to vaginal bleeding, contractions, leaking of  fluid and fetal movement were reviewed in detail with the patient. Please refer to After Visit Summary for other counseling recommendations.  No follow-ups on file.  Future Appointments  Date Time Provider Department Center  09/04/2023  1:00 PM Darrick Grinder, RD [facility] None  09/26/2023  8:00 AM [facility] PAT2 [facility] None  Due to language barrier, a Spanish interpreter Rande Lawman, [place] [id number] ) was present by phone during the history-taking, subsequent discussion, and physical exam with this patient.   Interpreter [place]- [phone]   Lenice Llamas, FNP

## 2023-08-28 ENCOUNTER — Ambulatory Visit: Payer: Self-pay | Admitting: Family Medicine

## 2023-08-28 VITALS — BP 99/67 | HR 93 | Temp 99.1°F | Wt 126.4 lb

## 2023-08-28 DIAGNOSIS — Z3A34 34 weeks gestation of pregnancy: Secondary | ICD-10-CM

## 2023-08-28 DIAGNOSIS — O24419 Gestational diabetes mellitus in pregnancy, unspecified control: Secondary | ICD-10-CM

## 2023-08-28 DIAGNOSIS — Z3481 Encounter for supervision of other normal pregnancy, first trimester: Secondary | ICD-10-CM

## 2023-08-28 DIAGNOSIS — Z3483 Encounter for supervision of other normal pregnancy, third trimester: Secondary | ICD-10-CM

## 2023-08-28 LAB — URINALYSIS
Bilirubin, UA: NEGATIVE
Glucose, UA: NEGATIVE
Nitrite, UA: NEGATIVE
Protein,UA: NEGATIVE
RBC, UA: NEGATIVE
Specific Gravity, UA: 1.02 (ref 1.005–1.030)
Urobilinogen, Ur: 0.2 mg/dL (ref 0.2–1.0)
pH, UA: 6.5 (ref 5.0–7.5)

## 2023-08-28 MED ORDER — METFORMIN HCL 500 MG PO TABS: 500.0000 mg | ORAL_TABLET | Freq: Two times a day (BID) | ORAL | 0 refills | Status: DC

## 2023-08-28 NOTE — Progress Notes (Signed)
 Presents to [facility] for RV and NST. Cherlynn Polo, RN

## 2023-08-28 NOTE — Progress Notes (Signed)
 Patient here for MH RV at 34 5/7. NST reviewed by provider before discontinued. Blood sugar log copied, good Rx card given and urine dip reviewed by provider. Burt Knack, RN

## 2023-08-29 NOTE — Addendum Note (Signed)
 Addended by: Heywood Bene on: 08/29/2023 02:07 PM   Modules accepted: Orders

## 2023-09-04 ENCOUNTER — Encounter: Payer: Self-pay | Attending: Family Medicine | Admitting: Dietician

## 2023-09-04 ENCOUNTER — Ambulatory Visit: Payer: Self-pay | Admitting: Family Medicine

## 2023-09-04 ENCOUNTER — Encounter: Payer: Self-pay | Admitting: Dietician

## 2023-09-04 VITALS — Wt 124.8 lb

## 2023-09-04 VITALS — BP 88/68 | HR 85 | Temp 98.3°F | Wt 125.4 lb

## 2023-09-04 DIAGNOSIS — Z3481 Encounter for supervision of other normal pregnancy, first trimester: Secondary | ICD-10-CM

## 2023-09-04 DIAGNOSIS — O24419 Gestational diabetes mellitus in pregnancy, unspecified control: Secondary | ICD-10-CM | POA: Insufficient documentation

## 2023-09-04 DIAGNOSIS — Z3A36 36 weeks gestation of pregnancy: Secondary | ICD-10-CM | POA: Insufficient documentation

## 2023-09-04 DIAGNOSIS — Z3483 Encounter for supervision of other normal pregnancy, third trimester: Secondary | ICD-10-CM

## 2023-09-04 DIAGNOSIS — Z3A35 35 weeks gestation of pregnancy: Secondary | ICD-10-CM

## 2023-09-04 LAB — URINALYSIS
Bilirubin, UA: NEGATIVE
Glucose, UA: NEGATIVE
Nitrite, UA: NEGATIVE
Protein,UA: NEGATIVE
RBC, UA: NEGATIVE
Specific Gravity, UA: 1.015 (ref 1.005–1.030)
Urobilinogen, Ur: 0.2 mg/dL (ref 0.2–1.0)
pH, UA: 7 (ref 5.0–7.5)

## 2023-09-04 MED ORDER — METFORMIN HCL 500 MG PO TABS: 1000.0000 mg | ORAL_TABLET | Freq: Two times a day (BID) | ORAL | Status: DC

## 2023-09-04 NOTE — Progress Notes (Signed)
 [facility] [address], [address] [place] [address] Main phone: [phone]  Prenatal Visit Minerva Areola [phone] Due to language barrier, a Spanish interpreter Minerva Areola, [place] [phone] ) was present by phone during the history-taking, subsequent discussion, and physical exam with this patient.   Subjective:  Angela Terry is a 33 y.o. [phone] at [redacted]w[redacted]d being seen today for ongoing prenatal care.  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  Contractions: Irritability. Vag. Bleeding: None.  Movement: Present. Denies leaking of fluid/ROM.   Contractions - occasionally (couple times daily) - not associated with fluid loss or vaginal bleeding  Fatigue   Leg pains - associated with long hours standing, walking - relieved with rest and elevation  The following portions of the patient's history were reviewed and updated as appropriate: allergies, current medications, past family history, past medical history, past social history, past surgical history and problem list. Problem list updated.  Objective:   Vitals:   09/04/23 1139  BP: (!) 88/68  Pulse: 85  Temp: 98.3 F (36.8 C)  Weight: 125 lb 6.4 oz (56.9 kg)   Fetal Status: Fetal Heart Rate (bpm): 146 Fundal Height: 35 cm Movement: Present  Presentation: Vertex  General:  Alert, oriented and cooperative. Patient is in no acute distress.  Skin: Skin is warm and dry. No rash noted.   Cardiovascular: Normal heart rate noted  Respiratory: Normal respiratory effort, no problems with respiration noted  Abdomen:  Soft, gravid, appropriate for gestational age.  Pain/Pressure: Absent     Pelvic: Cervical exam deferred        Extremities: Normal range of motion.     Mental Status: Normal mood and affect. Normal behavior. Normal judgment and thought content.   Assessment and Plan:  Pregnancy: [id number] at [redacted]w[redacted]d  [redacted] weeks gestation of pregnancy  Prenatal care, subsequent pregnancy, first trimester Assessment & Plan: Doing well. BP slightly hypotensive at 88/68. TWG 1 lb 6.4 oz (0.635 kg). Taking prenatal vitamin daily. Next prenatal appointment in 1 weeks.   Discussed or disclosed in AVS: - Breast feeding support through [facility] breast feeding peer counselor program - Mood resources, including 988, Maternal Mental Health Line - Prenatal classes - Pain control during delivery: un-medicated delivery (but overall undecided) - Support person during delivery: Husband - Anticipated contraception: hormonal injection - Pre-eclampsia warning signs: Signs and symptoms of preeclampsia were verbally reviewed with warning signs, when and how to call. A written handout with tips on how to take your blood pressure, as well as warning signs was provided to the patient.    Gestational diabetes mellitus (GDM) affecting pregnancy 07/24/23 @29  wks Assessment & Plan: Approx 15-20% of AM fasting and 1 hour post-prandial glucose measurements were above goal of <95 and <140, respectively. Max measurement 158. Will increase metformin from 500 to 1,000 mg BID. Patient reports she has plenty of tablets at home. Has nutrition appointment at 1pm today. Provided new glucose logs.  Return in one week.   Orders: -     Urinalysis -     metFORMIN HCl; Take 2 tablets (1,000 mg total) by mouth in the morning and  at bedtime.  Preterm labor symptoms and general obstetric precautions including but not limited to vaginal bleeding, contractions, leaking of fluid and fetal movement were reviewed in detail with the patient. Please refer to After  Visit Summary for other counseling recommendations.  No follow-ups on file.  Future Appointments  Date Time Provider Department Center  09/04/2023  1:00 PM Darrick Grinder, RD [facility] None  09/05/2023  9:00 AM [facility] PROVIDER [facility] None  09/11/2023  9:00 AM [facility] PROVIDER [facility] None  09/26/2023  8:00 AM [facility] PAT2 [facility] None   Clydene Fake, MD

## 2023-09-04 NOTE — Progress Notes (Signed)
 Diabetes Self-Management Education  Visit Type: First/Initial  Appt. Start Time: 1315 Appt. End Time: 1430  09/04/2023  [name], identified by name and date of birth, is a 33 y.o. female with a diagnosis of Diabetes: Gestational Diabetes.   ASSESSMENT  Weight 124 lb 12.8 oz (56.6 kg), last menstrual period 12/24/2022, unknown if currently breastfeeding. Body mass index is 27.98 kg/m.   Diabetes Self-Management Education - 09/04/23 1736       Visit Information   Visit Type First/Initial      Initial Visit   Diabetes Type Gestational Diabetes    Date Diagnosed 07/2023    Are you currently following a meal plan? Yes    What type of meal plan do you follow? avoiding high carb/ sugary foods and beverages    Are you taking your medications as prescribed? Yes      Psychosocial Assessment   Patient Belief/Attitude about Diabetes Motivated to manage diabetes    Learning Readiness Change in progress    How often do you need to have someone help you when you read instructions, pamphlets, or other written materials from your doctor or pharmacy? 3 - Sometimes   reads Spanish but not [place]     Pre-Education Assessment   Patient understands the diabetes disease and treatment process. Needs Instruction    Patient understands incorporating nutritional management into lifestyle. Needs Instruction    Patient undertands incorporating physical activity into lifestyle. Needs Instruction    Patient understands using medications safely. Comprehends key points    Patient understands monitoring blood glucose, interpreting and using results Needs Review    Patient understands prevention, detection, and treatment of acute complications. Needs Instruction    Patient understands prevention, detection, and treatment of chronic complications. N/A (comment)   gestational diabetes   Patient understands how to develop strategies to address psychosocial issues. Needs Instruction     Patient understands how to develop strategies to promote health/change behavior. Comprehends key points      Complications   How often do you check your blood sugar? 3-4 times/day    Fasting Blood glucose range (mg/dL) 74-259   56-38   Postprandial Blood glucose range (mg/dL) 75-643   329J   Number of hypoglycemic episodes per month 0    Are you checking your feet? N/A      Dietary Intake   Breakfast egg with sausage, sometimes vegetables    Lunch tortilla with chicken soup    Snack (afternoon) sandwich -- ham and cheese on whole grain bread    Dinner meat with veg ie broccoli or squash    Snack (evening) occasionally milk or fruit ie grapes    Beverage(s) water only      Activity / Exercise   Activity / Exercise Type Light (walking / raking leaves)    How many days per week do you exercise? 3.5    How many minutes per day do you exercise? 25    Total minutes per week of exercise 87.5      Patient Education   Previous Diabetes Education No    Disease Pathophysiology Other (comment)   definition of gestational diabetes and effects on baby and mother   Healthy Eating Role of diet in the treatment of diabetes and the relationship between the three main macronutrients and blood glucose level;Food label reading, portion sizes and measuring food.;Plate Method;Reviewed blood glucose goals for pre and post meals and how to evaluate the patients' food intake on their blood glucose  level.;Meal timing in regards to the patients' current diabetes medication.;Meal options for control of blood glucose level and chronic complications.;Other (comment)    Being Active Role of exercise on diabetes management, blood pressure control and cardiac health.    Monitoring Purpose and frequency of SMBG.;Identified appropriate SMBG and/or A1C goals.    Acute complications Taught prevention, symptoms, and  treatment of hypoglycemia - the 15 rule.    Diabetes Stress and Support Role of stress on diabetes     Preconception care Pregnancy and GDM  Role of pre-pregnancy blood glucose control on the development of the fetus;Reviewed with patient blood glucose goals with pregnancy      Post-Education Assessment   Patient understands the diabetes disease and treatment process. Comprehends key points    Patient understands incorporating nutritional management into lifestyle. Comprehends key points    Patient undertands incorporating physical activity into lifestyle. Comprehends key points    Patient understands using medications safely. Comphrehends key points    Patient understands monitoring blood glucose, interpreting and using results Demonstrates understanding / competency    Patient understands prevention, detection, and treatment of acute complications. Comprehends key points    Patient understands prevention, detection, and treatment of chronic complications. N/A    Patient understands how to develop strategies to address psychosocial issues. Needs Review    Patient understands how to develop strategies to promote health/change behavior. Demonstrates understanding / competency      Outcomes   Expected Outcomes Demonstrated interest in learning. Expect positive outcomes    Future DMSE PRN    Program Status Completed             Individualized Plan for Diabetes Self-Management Training:   Learning Objective:  Patient will have a greater understanding of diabetes self-management. Patient education plan is to attend individual and/or group sessions per assessed needs and concerns.   Plan:   Patient Instructions  Continue to eat at regular intervals, every 3-4 hours during the day.  Allow 30-45 grams of carb foods with each meal. Include a protein food with each meal and snack.  Continue with regular light exercise.  Keep up healthy lifestyle habits after pregnancy.  Expected Outcomes:  Demonstrated interest in learning. Expect positive outcomes  Education material provided:  Gestational diabetes packet, plate planner with food lists, blood sugar testing record -- all materials in Spanish  If problems or questions, patient to contact team via:  Phone via spanish interpreter  Future DSME appointment: PRN

## 2023-09-04 NOTE — Patient Instructions (Addendum)
 I translated the following text using Google translate.  Please excuse any errors.  He traducido el siguiente texto con el traductor de Microbiologist. Disculpe cualquier error.  Pregnancy Continue taking your prenatal vitamin daily.  Please schedule your next prenatal visit for about 1 weeks from now.  Please visit WIC (women's, infants, and children program) to see about their breast feeding peer support program. You can start this program to get tips and tricks for successful breast feeding of your baby.  Embarazo Contine tomando sus vitamina prenatal diaria. Programe su prxima visita prenatal para dentro de aproximadamente 1 [address]. Visita WIC (programa para mujeres, bebs y nios) para Solicitor su programa de apoyo entre pares para la Tour manager. Puedes iniciar este programa para obtener consejos y trucos para una lactancia exitosa de tu beb.  Diabetes Please increase your metformin (the diabetes medicine) to two pills in the morning and two pills at bedtime.  We will look at your blood sugar numbers next appointment.  Diabetes Aumente su dosis de metformina (el medicamento para la diabetes) a dos pastillas por la maana y [place] pastillas antes de [place]. Revisaremos sus niveles de azcar en sangre en la prxima cita.  Prenatal Classes If delivering at [facility] with [facility] or [facility]: Go to [email or web address]   If delivering at [facility]: Go to [email or web address] and search for "Pregnancy and Parenting Classes" "Prepared Childbirth Classes" Clases prenatales Si el parto se realizar en [facility] con el OB de la [facility] o en [place] [facility]: Visite [email or web address]  Si el parto se realizar en la [facility]: visite [email or web address] y busque: "Clases de embarazo y crianza" "Clases de preparacin para el parto"  Resource  regarding exposures that could affect pregnancy: Mother To P[facility]ola [employer] is a Dentist with lots of information on the effects of many medications and exposures on your pregnancy. It is free to use, including their web site, phone line, text service, app, or email and live chat.  [email or web address]/ Email or live chat: MotherToBaby.org Phone: ([phone] Text: [phone] App: search "LactRx"  Recursos sobre exposiciones que podran afectar el embarazo: Mother To Baby es una organizacin sin fines de Visual merchandiser con mucha informacin [employer] Stationers efectos de muchos medicamentos y exposiciones en Firefighter. Su uso es gratuito, incluido su sitio web, Art therapist, servicio de mensajes de texto, aplicacin o correo electrnico y chat en vivo. CDApps.pl Correo electrnico o chat en vivo: MotherToBaby.org Telfono: [phone] Mensaje de texto: [phone] Aplicacin: busque "LactRx"  Maternal Mental Health If you start to develop the below symptoms of depression, please reach out to [place] for an appointment. There is also a Biomedical scientist Health Hotline at [phone]). This hotline has trained counselors, doulas, and midwifes to real-time support, information, and resources.  Feeling sad or hopeless most of the time Lack of interest in things you used to enjoy Less interest in caring for yourself (dressing, fixing hair) Trouble concentrating Trouble coping with daily tasks Constant worry about your baby Sleeping or eating too much or too little Feeling very anxious or nervous Unexplained irritability or anger Unwanted or scary thoughts Feeling that you are not a good mother Thoughts of hurting yourself or your baby If you feel you are experiencing a mental health crisis, please reach out to the National Suicide Prevention Hotline at 1-800-273-TALK [phone]).  Salud Mental Materna Si comienza a  Environmental education officer los siguientes sntomas de depresin, comunquese con nosotros para programar una cita. Tambin hay una lnea  directa nacional de salud mental materna en el 1-833-9-HELP4MOMS [phone]). Esta lnea directa ha capacitado a consejeros, doulas y parteras para brindar apoyo, informacin y Occupational hygienist real.  [place] Sizer triste o sin esperanza la mayor parte del tiempo  Falta de inters en las cosas que sola disfrutar  Menos inters en cuidarse a s mismo (vestirse, arreglarse el cabello)  Problemas para concentrarse  Problemas para hacer frente a las tareas diarias  Preocupacin constante por su beb  Dormir o Microbiologist o muy poco  Sentirse muy ansioso o nervioso  Irritabilidad o ira inexplicables  Pensamientos no deseados o Insurance underwriter que no eres una buena madre  Pensamientos de Runner, broadcasting/film/video a s Visual merchandiser o a su beb Si cree que est experimentando una crisis de salud mental, comunquese con la [employer] de Prevencin del Suicidio al 1-800-273-TALK [phone]).

## 2023-09-04 NOTE — Progress Notes (Signed)
 Kept 08/23/23 [facility] [place] [facility] appt. Aware of [facility] appt today. Aware of 09/26/23 0800 preadmission testing phone call appt. Aware of 09/29/23 C-section at [facility]. Due to [facility] appt, NST not done today and rescheduled for 0900 tomorrow per Dr. Larita Fife. Appt reminder card given for NST and for 1 week MHC RV appt with NST.Marland Kitchen Blood sugar log copied and in outguide for provider review. Urine dip today. Jossie Ng, RN

## 2023-09-04 NOTE — Assessment & Plan Note (Signed)
 Doing well. BP slightly hypotensive at 88/68. TWG 1 lb 6.4 oz (0.635 kg). Taking prenatal vitamin daily. Next prenatal appointment in 1 weeks.   Discussed or disclosed in AVS: - Breast feeding support through [facility] breast feeding peer counselor program - Mood resources, including 988, Maternal Mental Health Line - Prenatal classes - Pain control during delivery: un-medicated delivery (but overall undecided) - Support person during delivery: Husband - Anticipated contraception: hormonal injection - Pre-eclampsia warning signs: Signs and symptoms of preeclampsia were verbally reviewed with warning signs, when and how to call. A written handout with tips on how to take your blood pressure, as well as warning signs was provided to the patient.

## 2023-09-04 NOTE — Patient Instructions (Signed)
 Continue to eat at regular intervals, every 3-4 hours during the day.  Allow 30-45 grams of carb foods with each meal. Include a protein food with each meal and snack.  Continue with regular light exercise.  Keep up healthy lifestyle habits after pregnancy.

## 2023-09-04 NOTE — Assessment & Plan Note (Addendum)
 Approx 15-20% of AM fasting and 1 hour post-prandial glucose measurements were above goal of <95 and <140, respectively. Max measurement 158. Will increase metformin from 500 to 1,000 mg BID. Patient reports she has plenty of tablets at home. Has nutrition appointment at 1pm today. Provided new glucose logs.  Return in one week.

## 2023-09-05 ENCOUNTER — Ambulatory Visit: Payer: Self-pay | Admitting: Family Medicine

## 2023-09-05 VITALS — BP 95/57 | HR 80 | Wt 123.8 lb

## 2023-09-05 DIAGNOSIS — Z3689 Encounter for other specified antenatal screening: Secondary | ICD-10-CM

## 2023-09-05 NOTE — Progress Notes (Signed)
 1. NST (non-stress test) reactive (Primary) -reassuring NST for GA  [facility] FNP-C

## 2023-09-05 NOTE — Progress Notes (Signed)
 Here for NST only today.Burt Knack, RN

## 2023-09-11 ENCOUNTER — Ambulatory Visit: Payer: Self-pay | Admitting: Family Medicine

## 2023-09-11 ENCOUNTER — Encounter: Payer: Self-pay | Admitting: Family Medicine

## 2023-09-11 VITALS — BP 90/60 | HR 83 | Temp 97.6°F | Wt 123.2 lb

## 2023-09-11 DIAGNOSIS — Z3483 Encounter for supervision of other normal pregnancy, third trimester: Secondary | ICD-10-CM

## 2023-09-11 DIAGNOSIS — O24419 Gestational diabetes mellitus in pregnancy, unspecified control: Secondary | ICD-10-CM

## 2023-09-11 DIAGNOSIS — Z3A36 36 weeks gestation of pregnancy: Secondary | ICD-10-CM

## 2023-09-11 DIAGNOSIS — Z3481 Encounter for supervision of other normal pregnancy, first trimester: Secondary | ICD-10-CM

## 2023-09-11 LAB — URINALYSIS
Bilirubin, UA: NEGATIVE
Glucose, UA: NEGATIVE
Leukocytes,UA: NEGATIVE
Nitrite, UA: NEGATIVE
Protein,UA: NEGATIVE
RBC, UA: NEGATIVE
Specific Gravity, UA: >1.03 — ABNORMAL HIGH (ref 1.005–1.030)
Urobilinogen, Ur: 0.2 mg/dL (ref 0.2–1.0)
pH, UA: 6.5 (ref 5.0–7.5)

## 2023-09-11 NOTE — Progress Notes (Signed)
 [facility] [address] [place] [address] Main phone: [phone]  Prenatal Visit  Subjective:  Angela Terry is a 33 y.o. [id number] at [redacted]w[redacted]d being seen today for ongoing prenatal care.  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  Contractions: Irregular. Vag. Bleeding: None.  Movement: Present. Denies leaking of fluid/ROM.   The following portions of the patient's history were reviewed and updated as appropriate: allergies, current medications, past family history, past medical history, past social history, past surgical history and problem list. Problem list updated.  Objective:   Vitals:   09/11/23 0907  BP: 90/60  Pulse: 83  Temp: 97.6 F (36.4 C)  Weight: 123 lb 3.2 oz (55.9 kg)    Fetal Status: Fetal Heart Rate (bpm): 144 Fundal Height: 36 cm Movement: Present  Presentation: Vertex  General:  Alert, oriented and cooperative. Patient is in no acute distress.  Skin: Skin is warm and dry. No rash noted.   Cardiovascular: Normal heart rate noted  Respiratory: Normal respiratory effort, no problems with respiration noted  Abdomen: Soft, gravid, appropriate for gestational age.  Pain/Pressure: Absent     Pelvic: Cervical exam deferred        Extremities: Normal range of motion.  Edema: None  Mental Status: Normal mood and affect. Normal behavior. Normal judgment and thought content.   Assessment and Plan:  Pregnancy: [id number] at [redacted]w[redacted]d  1. [redacted] weeks gestation of pregnancy -routine 36 week labs today -swabbed with Estill Dooms as  chaperone  2. Prenatal care, subsequent pregnancy, first trimester (Primary) -taking PNV daily  3. Gestational diabetes mellitus (GDM) affecting pregnancy -taking metformin 1000mg  BID  -had [facility] appt on 09/04/23 -12.8 oz (-0.363 kg) UA reviewed- negative for glucose  NST today  Blood sugar log values below. Encouraged continued daily exercise and diet modifications. 1 of  6 FBS values are abnormal (range was 85 to 97) 2 of 15 2hr pp values are abnormal (range was 103 to 159) Exercise: walks 20.-30  minutes- 3-4 times a week  Diet recall :   Breakfast = coffee with milk and bread (whole wheat)                       Lunch = steak and 2 tortillas with avocado                       Dinner = 3 eggs with broccoli                       Snack = bread with cheese  Water: drinks about  5 bottles of water/day and night  - Urinalysis   Preterm labor symptoms and general obstetric precautions including but not limited to vaginal bleeding, contractions, leaking of fluid and fetal movement were reviewed in detail with the patient. Please refer to After Visit Summary for other counseling recommendations.  Return in about 1 week (around 09/18/2023) for Routine Prenatal Care.  Future Appointments  Date Time Provider Department Center  09/18/2023 10:20 AM [facility] PROVIDER [facility] None  09/26/2023  8:00 AM  [facility] PAT2 [facility] None  Due to language barrier, a Spanish interpreter Rachel Bo T.) was present in person during the history-taking, subsequent discussion, and physical exam with this patient.   Lenice Llamas, [place]

## 2023-09-11 NOTE — Progress Notes (Signed)
 NST strip reviewed by Aliene Altes FNP-C and read as reactive. Monitoring discontinued. 1 week MHC RV and NST appt scheduled for 09/18/23 and appt reminder card given to client. [employer] [phone] interpreted during this portion of visit. Jossie Ng, RN

## 2023-09-11 NOTE — Progress Notes (Signed)
 Patient here for MH RV and NST. 36 week labs today.Patient prefers provider to collect. Urine dip reviewed. Blood sugar log copied.Marland KitchenMarland KitchenBurt Knack, RN

## 2023-09-13 LAB — CHLAMYDIA/GC NAA, CONFIRMATION
Chlamydia trachomatis, NAA: NEGATIVE
Neisseria gonorrhoeae, NAA: NEGATIVE

## 2023-09-15 LAB — CULTURE, BETA STREP (GROUP B ONLY): Strep Gp B Culture: NEGATIVE

## 2023-09-15 NOTE — H&P (Signed)
 Angela Terry is a 33 y.o. female presenting forelective repeat LTCS  possible BTL if significant scar tissue at time of surgery [facility] 10/04/23 based on LMP . Ega at surgery 39+2  A2 GDM on metformin 1000 mg bid . A1C  OB History     Gravida  4   Para  2   Term  2   Preterm  0   AB  1   Living  2      SAB  1   IAB  0   Ectopic  0   Multiple  0   Live Births  2          Past Medical History:  Diagnosis Date   Abnormal Pap smear of cervix    questionable hx of 2018 abnormal PAP at [facility]   Dental neglect    last exam years ago   Gestational diabetes mellitus (GDM) affecting pregnancy 07/24/23 @29  wks 07/25/2023   Current Diabetic Medications:  None, Glyburide, Metformin, and Insulin   [ x]ordered meter, strips, lancets- Date: 07/25/23  Diabetic Teaching for A1GDM or A2GDM: [ ]  [facility] at [facility]  [ ]  [facility] [ ]  WIC MNT   Baseline and surveillance labs (pulled in from [facility], refresh links as needed)  Lab Results  Component Value Date   CREATININE 0.50 02/21/2019   No results found for: "*   History of spontaneous abortion 07/2022   Nausea & vomiting    due to pregnancy   Past Surgical History:  Procedure Laterality Date   CESAREAN SECTION  02/17/2010   [place]   CESAREAN SECTION N/A 02/23/2019   Procedure: CESAREAN SECTION;  Surgeon: Feliberto Gottron, Ihor Austin, MD;  Location: [facility] ORS;  Service: Obstetrics;  Laterality: N/A;   Family History: family history includes Alcohol abuse in her father; Autism in her son; Diabetes in her brother; Healthy in her daughter, sister, and sister; Heart attack in her mother. Social History:  reports that she has never smoked. She has been exposed to tobacco smoke. She has never used smokeless tobacco. She reports that she does not drink alcohol and does not use drugs.     Maternal Diabetes: not records  Genetic Screening: NIPT neg  , afp neg  Maternal Ultrasounds/Referrals:  Other: Fetal Ultrasounds or other Referrals:  None Maternal Substance Abuse:  No Significant Maternal Medications: metformin , PNV  Significant Maternal Lab Results:  Other: gbs negative  Number of Prenatal Visits:greater than 3 verified prenatal visits Maternal Vaccinations:+Flu, +TDAP  RSV ? Other Comments:  None  Review of Systems History   Last menstrual period 12/24/2022, unknown if currently breastfeeding. Exam Physical Exam  Bp 90/60 Lungs cta   CVrrr Adb gravid  Prenatal labs: ABO, Rh: O/Positive/-- (10/07 1115) Antibody: Negative (10/07 1115) Rubella: 3.19 (10/07 1115) RPR: Non Reactive (02/03 1133)  HBsAg: Negative (10/07 1115)  HIV: Non Reactive (10/07 1115)  GBS: Negative/-- (03/31 1005)   Assessment/Plan: A2 GDM on metformin desires elective repeat cesarean . Discussed with translator present if the patient has extensive scarring then she consent to a BTL at time of surgery    Ihor Austin Yousif Edelson 09/15/2023, 10:10 AM

## 2023-09-18 ENCOUNTER — Ambulatory Visit: Payer: Self-pay | Admitting: Family Medicine

## 2023-09-18 VITALS — BP 106/68 | HR 89 | Temp 97.3°F | Wt 125.2 lb

## 2023-09-18 DIAGNOSIS — Z3A37 37 weeks gestation of pregnancy: Secondary | ICD-10-CM

## 2023-09-18 DIAGNOSIS — O09899 Supervision of other high risk pregnancies, unspecified trimester: Secondary | ICD-10-CM

## 2023-09-18 DIAGNOSIS — O24419 Gestational diabetes mellitus in pregnancy, unspecified control: Secondary | ICD-10-CM

## 2023-09-18 LAB — URINALYSIS
Bilirubin, UA: NEGATIVE
Glucose, UA: NEGATIVE
Ketones, UA: NEGATIVE
Nitrite, UA: NEGATIVE
Protein,UA: NEGATIVE
RBC, UA: NEGATIVE
Specific Gravity, UA: 1.015 (ref 1.005–1.030)
Urobilinogen, Ur: 0.2 mg/dL (ref 0.2–1.0)
pH, UA: 6 (ref 5.0–7.5)

## 2023-09-18 NOTE — Progress Notes (Addendum)
 Here today for 37.5 week MH RV. Taking PNV every day. Denies ED/hospital visits since last RV. UA and NST today. Has repeat C/S for 09/29/23. NST initiated at 10:56 am. Tawny Hopping, RN  Provider into read NST strip. Per same [facility] to d/c NST. NST d/c'd at 12:35. Tawny Hopping, RN

## 2023-09-18 NOTE — Progress Notes (Signed)
 [facility] [address] [place] [address] Main phone: [phone]  Prenatal Visit  Subjective:  Angela Terry is a 33 y.o. [phone] at [redacted]w[redacted]d being seen today for ongoing prenatal care.  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  Contractions: Irregular. Vag. Bleeding: None.  Movement: Present. Denies leaking of fluid/ROM.   The following portions of the patient's history were reviewed and updated as appropriate: allergies, current medications, past family history, past medical history, past social history, past surgical history and problem list. Problem list updated.  Objective:   Vitals:   09/18/23 1056  BP: 106/68  Pulse: 89  Temp: (!) 97.3 F (36.3 C)  Weight: 125 lb 3.2 oz (56.8 kg)    Fetal Status: Fetal Heart Rate (bpm): 154 Fundal Height: 36 cm Movement: Present  Presentation: Vertex  General:  Alert, oriented and cooperative. Patient is in no acute distress.  Skin: Skin is warm and dry. No rash noted.   Cardiovascular: Normal heart rate noted  Respiratory: Normal respiratory effort, no problems with respiration noted  Abdomen: Soft, gravid, appropriate for gestational age.  Pain/Pressure: Present     Pelvic: Cervical exam deferred        Extremities: Normal range of motion.  Edema: None  Mental Status: Normal mood and affect. Normal behavior. Normal judgment and thought content.   Assessment and Plan:  Pregnancy: [id number] at [redacted]w[redacted]d  1. [redacted] weeks gestation of pregnancy -reviewed 36 week cultures- wnl  2. Supervision of other  high risk pregnancy, antepartum (Primary) -reports feeling good- no concerns about self  3. Gestational diabetes mellitus (GDM) affecting pregnancy 07/24/23 @29  wks -taking Metformin 1000 mg BID -urine dip reviewed= no glucose present -NST- reviewed-reassuring for GA- initial 18 minutes with moderate variability and accelerations, then 13 minutes where HR variability hovers between 5-6 bpm, reviewed with Dr Larita Fife, baby moving visibly in abdomen  -has appt for C/S on 09/29/23  Blood sugar log values below. Encouraged continued daily exercise and diet modifications. 1 of  7 FBS values are abnormal (range was 88 to 99) 0 of 18 2hr pp values are abnormal (range was 107 to 132) Exercise: walks 30. minutes per day Diet recall :   Breakfast = sandwich on whole wheat bread with ham and cheese, 1% milk                      Lunch = meat with salad and 3 tortillas                      Dinner = 2 [place] tamales                      Snack = none Water: drinks about  6 bottles of water/day and night    Term labor symptoms and general obstetric precautions including but not limited to vaginal bleeding, contractions, leaking of fluid and fetal movement were reviewed in detail with the patient. Please refer to After Visit Summary for other counseling recommendations.  No follow-ups on file.  Future Appointments  Date Time Provider  Department Center  09/25/2023 10:50 AM [facility] PROVIDER [facility] None  09/26/2023  8:00 AM [facility] PAT2 [facility] None  Due to language barrier, a Spanish interpreter [employer], ID forgot to write down ) was present by phone during the history-taking, subsequent discussion, and physical exam with this patient.   Lenice Llamas, [place]

## 2023-09-25 ENCOUNTER — Encounter: Payer: Self-pay | Admitting: Family Medicine

## 2023-09-25 ENCOUNTER — Ambulatory Visit: Payer: Self-pay | Admitting: Family Medicine

## 2023-09-25 VITALS — BP 95/62 | HR 78 | Temp 97.9°F | Wt 125.0 lb

## 2023-09-25 DIAGNOSIS — O09899 Supervision of other high risk pregnancies, unspecified trimester: Secondary | ICD-10-CM

## 2023-09-25 DIAGNOSIS — Z98891 History of uterine scar from previous surgery: Secondary | ICD-10-CM

## 2023-09-25 DIAGNOSIS — O24419 Gestational diabetes mellitus in pregnancy, unspecified control: Secondary | ICD-10-CM

## 2023-09-25 DIAGNOSIS — Z3A38 38 weeks gestation of pregnancy: Secondary | ICD-10-CM

## 2023-09-25 LAB — URINALYSIS
Bilirubin, UA: NEGATIVE
Glucose, UA: NEGATIVE
Ketones, UA: NEGATIVE
Nitrite, UA: NEGATIVE
Protein,UA: NEGATIVE
RBC, UA: NEGATIVE
Specific Gravity, UA: 1.01 (ref 1.005–1.030)
Urobilinogen, Ur: 0.2 mg/dL (ref 0.2–1.0)
pH, UA: 6 (ref 5.0–7.5)

## 2023-09-25 NOTE — Progress Notes (Signed)
 [facility] [address], [address] [place] [address] Main phone: [phone]  Prenatal Visit  Subjective:  Angela Terry is a 33 y.o. [phone] at [redacted]w[redacted]d being seen today for ongoing prenatal care.  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  Contractions: Irregular. Vag. Bleeding: None.  Movement: Present. Denies leaking of fluid/ROM.   The following portions of the patient's history were reviewed and updated as appropriate: allergies, current medications, past family history, past medical history, past social history, past surgical history and problem list. Problem list updated.  Objective:   Vitals:   09/25/23 1044  BP: 95/62  Pulse: 78  Temp: 97.9 F (36.6 C)  Weight: 125 lb (56.7 kg)    Fetal Status: Fetal Heart Rate (bpm): 154 Fundal Height: 38 cm Movement: Present     General:  Alert, oriented and cooperative. Patient is in no acute distress.  Skin: Skin is warm and dry. No rash noted.   Cardiovascular: Normal heart rate noted  Respiratory: Normal respiratory effort, no problems with respiration noted  Abdomen: Soft, gravid, appropriate for gestational age.  Pain/Pressure: Present     Pelvic: Cervical exam deferred        Extremities: Normal range of motion.  Edema: None  Mental Status: Normal mood and affect. Normal behavior. Normal judgment and thought content.   Assessment and Plan:  Pregnancy: [id number] at [redacted]w[redacted]d  1. [redacted] weeks gestation of pregnancy (Primary)   2. Supervision of other high risk pregnancy, antepartum -PNV daily   3.  Gestational diabetes mellitus (GDM) affecting pregnancy Blood sugar log values below. Encouraged continued daily exercise and diet modifications. 3 of  7 FBS values are abnormal (range was 90 to 101) 2of 20 2hr pp values are abnormal (range was 102 to 156) Exercise: walks 30. minutes per day Diet recall :   Breakfast = eggs with 2 tortillas , 1 % milk                       Lunch = broccoli, eggs with sausages                       Dinner = beef broth with 1 tortilla                       Snack = sandwich with ham and cheese on whole wheat  Water: drinks about  5 bottles of water/day and night  - Urinalysis= reviewed- negative for glucose -NST reviewed- reassuring for GA- reviewed with Dr. Larita Fife  4. History of C-section x2 -repeat C/S in 4 days- reviewed that with s/s of labor before c/s date- to go to the hospital    Term labor symptoms and general obstetric precautions including but not limited to vaginal bleeding, contractions, leaking of fluid and fetal movement were reviewed in detail with the patient. Please refer to After Visit Summary for other counseling recommendations.  No follow-ups on file.  Future Appointments  Date Time Provider Department Center  09/26/2023  8:00 AM [facility] PAT[facility] None  Due to language barrier, a  Spanish interpreter Edgar Goods T.) was present in person during the history-taking, subsequent discussion, and physical exam with this patient.   Earleen Glazier, [place]

## 2023-09-25 NOTE — Progress Notes (Signed)
 Patient here for MH RV at 38 5/7. Needs NST today. BS log copied and urine dip reviewed. Patient has C/S on 09/29/23. Aaron AasRosiland Cooks, RN

## 2023-09-26 ENCOUNTER — Encounter
Admission: RE | Admit: 2023-09-26 | Discharge: 2023-09-26 | Disposition: A | Discharge status: Home / Self Care | Payer: Self-pay | Source: Ambulatory Visit | Attending: Obstetrics and Gynecology | Admitting: Obstetrics and Gynecology

## 2023-09-26 DIAGNOSIS — O24419 Gestational diabetes mellitus in pregnancy, unspecified control: Secondary | ICD-10-CM

## 2023-09-26 DIAGNOSIS — Z98891 History of uterine scar from previous surgery: Secondary | ICD-10-CM

## 2023-09-26 DIAGNOSIS — Z01812 Encounter for preprocedural laboratory examination: Secondary | ICD-10-CM

## 2023-09-26 NOTE — Pre-Procedure Instructions (Signed)
 PAT interview completed, pre-anesthesia instructions given to patient with the help of [place] interpreter over phone.Patient verbalized understanding.

## 2023-09-26 NOTE — Patient Instructions (Addendum)
 Your procedure is scheduled on: 09/29/2023  Friday  Arrival Time: Please call Labor and Delivery the day before your scheduled C-Section to find out your arrival time. [phone]. Call between 1-3 pm on Thursday 09/28/2023   Arrival: If your arrival time is prior to 6:00 am, please enter through the Emergency Room Entrance and you will be directed to Labor and Delivery. If your arrival time is 6:00 am or later, please enter the Medical Mall and follow the greeter's instructions.  REMEMBER: Instructions that are not followed completely may result in serious medical risk, up to and including death; or upon the discretion of your surgeon and anesthesiologist your surgery may need to be rescheduled.  Do not eat food after midnight the night before surgery.  No gum chewing or hard candies.  You may however, drink CLEAR liquids up to 2 hours before you are scheduled to arrive for your surgery. Do not drink anything within 2 hours of your scheduled arrival time.  Clear liquids include: - water    In addition, your doctor has ordered for you to drink the provided:   Gatorade G2 Drinking this carbohydrate drink up to two hours before surgery helps to reduce insulin resistance and improve patient outcomes. Please complete drinking 2 hours before scheduled arrival time.  One week prior to surgery: Stop Anti-inflammatories (NSAIDS) such as Advil, Aleve, Ibuprofen, Motrin, Naproxen, Naprosyn and Aspirin based products such as Excedrin, Goody's Powder, BC Powder. Stop ANY OVER THE COUNTER supplements until after surgery.  You may however, continue to take Tylenol if needed for pain up until the day of surgery.  Do not take metFORMIN (GLUCOPHAGE) two days prior to surgery    Continue taking all of your other prescription medications up until the day of surgery.  ON THE DAY OF SURGERY ONLY TAKE THESE MEDICATIONS WITH SIPS OF WATER:  Prenatal Vit-Fe Fumarate-FA (PRENATAL VITAMIN)    No  Alcohol for 24 hours before or after surgery.  No Smoking including e-cigarettes for 24 hours prior to surgery.  No chewable tobacco products for at least 6 hours prior to surgery.  No nicotine patches on the day of surgery.  Do not use any "recreational" drugs for at least a week prior to your surgery.  Please be advised that the combination of cocaine and anesthesia may have negative outcomes, up to and including death. If you test positive for cocaine, your surgery will be cancelled.  On the morning of surgery brush your teeth with toothpaste and water, you may rinse your mouth with mouthwash if you wish. Do not swallow any toothpaste or mouthwash.  Use CHG wipes as directed on instruction sheet.-provided for you  Do not wear jewelry, make-up, hairpins, clips or nail polish.  For welded (permanent) jewelry: bracelets, anklets, waist bands, etc.  Please have this removed prior to surgery.  If it is not removed, there is a chance that hospital personnel will need to cut it off on the day of surgery.  Do not wear lotions, powders, or perfumes.   Do not shave body hair from the neck down 48 hours before surgery.  Contact lenses, hearing aids and dentures may not be worn into surgery.  Do not bring valuables to the hospital. [facility] is not responsible for any missing/lost belongings or valuables.     Notify your doctor if there is any change in your medical condition (cold, fever, infection).  Wear comfortable clothing (specific to your surgery type) to the hospital.  After surgery,  you can help prevent lung complications by doing breathing exercises.  Take deep breaths and cough every 1-2 hours. Your doctor may order a device called an Incentive Spirometer to help you take deep breaths. When coughing or sneezing, hold a pillow firmly against your incision with both hands. This is called "splinting." Doing this helps protect your incision. It also decreases belly  discomfort.  Please call the Pre-admissions Testing Dept. at [phone] if you have any questions about these instructions.  Surgery Visitation Policy:  Visitor Passes   All visitors, including children, need an identification sticker when visiting. These stickers must be worn where they can be seen.   Labor & Delivery  Laboring women may have one designated support person and two other visitors of any age visit. The support person must remain the same. The visitors may switch with other visitors. Visitation is permitted 24 hours per day. The designated support person or a visitor over the age of 16 may sleep overnight in the patient's room. A doula registered with [place] for labor and delivery support is not considered a visitor. Doulas not registered with [facility] are considered visitors.  Mother Baby Unit, OB Specialty and Gynecological Care  A designated support person and three visitors of any age may visit. The three visitors may switch out. The designated support person or a visitor age 25 or older may stay overnight in the room. During the postpartum period (up to 6 weeks), if the mother is the patient, she can have her newborn stay with her if there is another support person present who can be responsible for the baby.     Preparing the Skin Before Surgery     To help prevent the risk of infection at your surgical site, we are now providing you with rinse-free Sage 2% Chlorhexidine Gluconate (CHG) disposable wipes.  Chlorhexidine Gluconate (CHG) Soap  o An antiseptic cleaner that kills germs and bonds with the skin to continue killing germs even after washing  o Used for showering the night before surgery and morning of surgery  The night before surgery: Shower or bathe with warm water. Do not apply perfume, lotions, powders. Wait one hour after shower. Skin should be dry and cool. Open Sage wipe package - use 6 disposable cloths. Wipe body using one  cloth for the right arm, one cloth for the left arm, one cloth for the right leg, one cloth for the left leg, one cloth for the chest/abdomen area, and one cloth for the back. Do not use on open wounds or sores. Do not use on face or genitals (private parts). If you are breast feeding, do not use on breasts. 5. Do not rinse, allow to dry. 6. Skin may feel "tacky" for several minutes. 7. Dress in clean clothes. 8. Place clean sheets on your bed and do not sleep with pets.  REPEAT ABOVE ON THE MORNING OF SURGERY BEFORE ARRIVING TO THE HOSPITAL.

## 2023-09-27 ENCOUNTER — Encounter: Payer: Self-pay | Admitting: Urgent Care

## 2023-09-27 ENCOUNTER — Encounter
Admission: RE | Admit: 2023-09-27 | Discharge: 2023-09-27 | Disposition: A | Discharge status: Home / Self Care | Payer: Self-pay | Source: Ambulatory Visit | Attending: Obstetrics and Gynecology | Admitting: Obstetrics and Gynecology

## 2023-09-27 DIAGNOSIS — Z01818 Encounter for other preprocedural examination: Secondary | ICD-10-CM

## 2023-09-27 DIAGNOSIS — Z01812 Encounter for preprocedural laboratory examination: Secondary | ICD-10-CM | POA: Insufficient documentation

## 2023-09-27 DIAGNOSIS — O24419 Gestational diabetes mellitus in pregnancy, unspecified control: Secondary | ICD-10-CM | POA: Insufficient documentation

## 2023-09-27 DIAGNOSIS — Z98891 History of uterine scar from previous surgery: Secondary | ICD-10-CM | POA: Insufficient documentation

## 2023-09-27 LAB — CBC
HCT: 36.1 % (ref 36.0–46.0)
Hemoglobin: 12.3 g/dL (ref 12.0–15.0)
MCH: 30.7 pg (ref 26.0–34.0)
MCHC: 34.1 g/dL (ref 30.0–36.0)
MCV: 90 fL (ref 80.0–100.0)
Platelets: 286 10*3/uL (ref 150–400)
RBC: 4.01 MIL/uL (ref 3.87–5.11)
RDW: 14.2 % (ref 11.5–15.5)
WBC: 9.1 10*3/uL (ref 4.0–10.5)
nRBC: 0 % (ref 0.0–0.2)

## 2023-09-27 LAB — TYPE AND SCREEN
ABO/RH(D): O POS
Antibody Screen: NEGATIVE
Extend sample reason: UNDETERMINED

## 2023-09-27 LAB — BASIC METABOLIC PANEL WITH GFR
Anion gap: 6 (ref 5–15)
BUN: 10 mg/dL (ref 6–20)
CO2: 22 mmol/L (ref 22–32)
Calcium: 8.8 mg/dL — ABNORMAL LOW (ref 8.9–10.3)
Chloride: 107 mmol/L (ref 98–111)
Creatinine, Ser: 0.46 mg/dL (ref 0.44–1.00)
GFR, Estimated: >60 mL/min (ref >60)
Glucose, Bld: 73 mg/dL (ref 70–99)
Potassium: 3.6 mmol/L (ref 3.5–5.1)
Sodium: 135 mmol/L (ref 135–145)

## 2023-09-28 LAB — RPR: RPR Ser Ql: NONREACTIVE

## 2023-09-29 ENCOUNTER — Encounter: Payer: Self-pay | Admitting: Obstetrics and Gynecology

## 2023-09-29 ENCOUNTER — Other Ambulatory Visit: Payer: Self-pay

## 2023-09-29 ENCOUNTER — Encounter: Admission: RE | Disposition: A | Discharge status: Home / Self Care | Payer: Self-pay | Source: Home / Self Care

## 2023-09-29 ENCOUNTER — Inpatient Hospital Stay
Admission: RE | Admit: 2023-09-29 | Discharge: 2023-10-01 | DRG: 787 | Disposition: A | Discharge status: Home / Self Care | Payer: MEDICAID

## 2023-09-29 ENCOUNTER — Inpatient Hospital Stay: Payer: MEDICAID | Admitting: Anesthesiology

## 2023-09-29 DIAGNOSIS — O9081 Anemia of the puerperium: Secondary | ICD-10-CM | POA: Diagnosis not present

## 2023-09-29 DIAGNOSIS — O09899 Supervision of other high risk pregnancies, unspecified trimester: Secondary | ICD-10-CM

## 2023-09-29 DIAGNOSIS — Z3A39 39 weeks gestation of pregnancy: Secondary | ICD-10-CM | POA: Diagnosis not present

## 2023-09-29 DIAGNOSIS — D62 Acute posthemorrhagic anemia: Secondary | ICD-10-CM | POA: Diagnosis not present

## 2023-09-29 DIAGNOSIS — Z8249 Family history of ischemic heart disease and other diseases of the circulatory system: Secondary | ICD-10-CM

## 2023-09-29 DIAGNOSIS — O34211 Maternal care for low transverse scar from previous cesarean delivery: Principal | ICD-10-CM | POA: Diagnosis present

## 2023-09-29 DIAGNOSIS — O24425 Gestational diabetes mellitus in childbirth, controlled by oral hypoglycemic drugs: Secondary | ICD-10-CM | POA: Diagnosis present

## 2023-09-29 DIAGNOSIS — Z603 Acculturation difficulty: Secondary | ICD-10-CM | POA: Diagnosis present

## 2023-09-29 DIAGNOSIS — Z3490 Encounter for supervision of normal pregnancy, unspecified, unspecified trimester: Secondary | ICD-10-CM

## 2023-09-29 DIAGNOSIS — Z9889 Other specified postprocedural states: Secondary | ICD-10-CM

## 2023-09-29 DIAGNOSIS — Z01818 Encounter for other preprocedural examination: Principal | ICD-10-CM

## 2023-09-29 DIAGNOSIS — O34219 Maternal care for unspecified type scar from previous cesarean delivery: Secondary | ICD-10-CM | POA: Diagnosis present

## 2023-09-29 LAB — GLUCOSE, CAPILLARY
Glucose-Capillary: 69 mg/dL — ABNORMAL LOW (ref 70–99)
Glucose-Capillary: 100 mg/dL — ABNORMAL HIGH (ref 70–99)
Glucose-Capillary: 91 mg/dL (ref 70–99)

## 2023-09-29 SURGERY — Surgical Case
Anesthesia: Spinal | Laterality: Bilateral

## 2023-09-29 MED ORDER — PHENYLEPHRINE HCL-NACL 20-0.9 MG/250ML-% IV SOLN: INTRAVENOUS | Status: DC | PRN

## 2023-09-29 MED ORDER — ACETAMINOPHEN 500 MG PO TABS
1000.0000 mg | ORAL_TABLET | Freq: Four times a day (QID) | ORAL | Status: DC
  Administered 2023-09-29: 1000 mg via ORAL
  Filled 2023-09-29: qty 2

## 2023-09-29 MED ORDER — ONDANSETRON HCL 4 MG/2ML IJ SOLN
INTRAMUSCULAR | Status: AC
Start: 2023-09-29 — End: ?
  Filled 2023-09-29: qty 2

## 2023-09-29 MED ORDER — ENOXAPARIN SODIUM 40 MG/0.4ML IJ SOSY
40.0000 mg | PREFILLED_SYRINGE | INTRAMUSCULAR | Status: DC
  Administered 2023-09-30: 40 mg via SUBCUTANEOUS
  Filled 2023-09-29: qty 0.4

## 2023-09-29 MED ORDER — MORPHINE SULFATE (PF) 0.5 MG/ML IJ SOLN
INTRAMUSCULAR | Status: AC
  Filled 2023-09-29: qty 10

## 2023-09-29 MED ORDER — OXYTOCIN-SODIUM CHLORIDE 30-0.9 UT/500ML-% IV SOLN
INTRAVENOUS | Status: DC | PRN
  Administered 2023-09-29: 30 mL via INTRAVENOUS

## 2023-09-29 MED ORDER — SODIUM CHLORIDE 0.9% FLUSH: 3.0000 mL | INTRAVENOUS | Status: DC | PRN

## 2023-09-29 MED ORDER — WITCH HAZEL-GLYCERIN EX PADS: 1.0000 | MEDICATED_PAD | CUTANEOUS | Status: DC | PRN

## 2023-09-29 MED ORDER — OXYTOCIN 10 UNIT/ML IJ SOLN
INTRAMUSCULAR | Status: AC
  Filled 2023-09-29: qty 2

## 2023-09-29 MED ORDER — KETOROLAC TROMETHAMINE 30 MG/ML IJ SOLN
INTRAMUSCULAR | Status: DC | PRN
  Administered 2023-09-29: 30 mg via INTRAVENOUS

## 2023-09-29 MED ORDER — PHENYLEPHRINE HCL-NACL 20-0.9 MG/250ML-% IV SOLN
INTRAVENOUS | Status: DC | PRN
  Administered 2023-09-29: 80 ug via INTRAVENOUS

## 2023-09-29 MED ORDER — MORPHINE SULFATE (PF) 0.5 MG/ML IJ SOLN
INTRAMUSCULAR | Status: DC | PRN
  Administered 2023-09-29: .1 mg via EPIDURAL

## 2023-09-29 MED ORDER — SOD CITRATE-CITRIC ACID 500-334 MG/5ML PO SOLN
30.0000 mL | ORAL | Status: AC
  Administered 2023-09-29: 30 mL via ORAL

## 2023-09-29 MED ORDER — GABAPENTIN 300 MG PO CAPS
300.0000 mg | ORAL_CAPSULE | Freq: Every day | ORAL | Status: DC
  Administered 2023-09-29 – 2023-10-01 (×2): 300 mg via ORAL
  Filled 2023-09-29 (×2): qty 1

## 2023-09-29 MED ORDER — ZOLPIDEM TARTRATE 5 MG PO TABS: 5.0000 mg | ORAL_TABLET | Freq: Every evening | ORAL | Status: DC | PRN

## 2023-09-29 MED ORDER — KETOROLAC TROMETHAMINE 30 MG/ML IJ SOLN
30.0000 mg | Freq: Four times a day (QID) | INTRAMUSCULAR | Status: AC | PRN
  Administered 2023-09-30: 30 mg via INTRAVENOUS

## 2023-09-29 MED ORDER — OXYTOCIN-SODIUM CHLORIDE 30-0.9 UT/500ML-% IV SOLN: 2.5000 [IU]/h | INTRAVENOUS | Status: AC

## 2023-09-29 MED ORDER — PHENYLEPHRINE 80 MCG/ML (10ML) SYRINGE FOR IV PUSH (FOR BLOOD PRESSURE SUPPORT)
PREFILLED_SYRINGE | INTRAVENOUS | Status: AC
  Filled 2023-09-29: qty 10

## 2023-09-29 MED ORDER — OXYTOCIN-SODIUM CHLORIDE 30-0.9 UT/500ML-% IV SOLN
INTRAVENOUS | Status: AC
  Filled 2023-09-29: qty 500

## 2023-09-29 MED ORDER — NALOXONE HCL 0.4 MG/ML IJ SOLN: 0.4000 mg | INTRAMUSCULAR | Status: DC | PRN

## 2023-09-29 MED ORDER — KETOROLAC TROMETHAMINE 30 MG/ML IJ SOLN: 30.0000 mg | Freq: Four times a day (QID) | INTRAMUSCULAR | Status: AC | PRN

## 2023-09-29 MED ORDER — PRENATAL MULTIVITAMIN CH
1.0000 | ORAL_TABLET | Freq: Every day | ORAL | Status: DC
  Administered 2023-09-29 – 2023-09-30 (×2): 1 via ORAL
  Filled 2023-09-29 (×2): qty 1

## 2023-09-29 MED ORDER — METHYLERGONOVINE MALEATE 0.2 MG/ML IJ SOLN
INTRAMUSCULAR | Status: DC | PRN
  Administered 2023-09-29: .2 mg via INTRAMUSCULAR

## 2023-09-29 MED ORDER — ONDANSETRON HCL 4 MG/2ML IJ SOLN: 4.0000 mg | Freq: Three times a day (TID) | INTRAMUSCULAR | Status: DC | PRN

## 2023-09-29 MED ORDER — FENTANYL CITRATE (PF) 100 MCG/2ML IJ SOLN
INTRAMUSCULAR | Status: DC | PRN
  Administered 2023-09-29: 5 ug via INTRAVENOUS

## 2023-09-29 MED ORDER — SCOPOLAMINE 1 MG/3DAYS TD PT72: 1.0000 | MEDICATED_PATCH | Freq: Once | TRANSDERMAL | Status: DC

## 2023-09-29 MED ORDER — SODIUM CHLORIDE 0.9 % IV SOLN: INTRAVENOUS | Status: DC

## 2023-09-29 MED ORDER — BUPIVACAINE IN DEXTROSE 0.75-8.25 % IT SOLN
INTRATHECAL | Status: DC | PRN
  Administered 2023-09-29: 1.4 mL via INTRATHECAL

## 2023-09-29 MED ORDER — MORPHINE SULFATE (PF) 2 MG/ML IV SOLN: 1.0000 mg | INTRAVENOUS | Status: DC | PRN

## 2023-09-29 MED ORDER — POVIDONE-IODINE 10 % EX SWAB
2.0000 | Freq: Once | CUTANEOUS | Status: DC
Start: 2023-09-29 — End: 2023-09-29

## 2023-09-29 MED ORDER — NALOXONE HCL 4 MG/10ML IJ SOLN: 1.0000 ug/kg/h | INTRAVENOUS | Status: DC | PRN

## 2023-09-29 MED ORDER — MEPERIDINE HCL 25 MG/ML IJ SOLN: 6.2500 mg | INTRAMUSCULAR | Status: DC | PRN

## 2023-09-29 MED ORDER — SOD CITRATE-CITRIC ACID 500-334 MG/5ML PO SOLN
ORAL | Status: AC
Start: 2023-09-29 — End: 2023-09-29
  Filled 2023-09-29: qty 15

## 2023-09-29 MED ORDER — DEXAMETHASONE SODIUM PHOSPHATE 10 MG/ML IJ SOLN
INTRAMUSCULAR | Status: AC
  Filled 2023-09-29: qty 1

## 2023-09-29 MED ORDER — SENNOSIDES-DOCUSATE SODIUM 8.6-50 MG PO TABS
2.0000 | ORAL_TABLET | Freq: Every day | ORAL | Status: DC
  Administered 2023-09-30 – 2023-10-01 (×2): 2 via ORAL
  Filled 2023-09-29 (×2): qty 2

## 2023-09-29 MED ORDER — ACETAMINOPHEN 500 MG PO TABS
1000.0000 mg | ORAL_TABLET | ORAL | Status: AC
  Administered 2023-09-29: 1000 mg via ORAL
  Filled 2023-09-29: qty 2

## 2023-09-29 MED ORDER — FENTANYL CITRATE (PF) 100 MCG/2ML IJ SOLN
INTRAMUSCULAR | Status: AC
Start: 2023-09-29 — End: ?
  Filled 2023-09-29: qty 2

## 2023-09-29 MED ORDER — DIBUCAINE (PERIANAL) 1 % EX OINT: 1.0000 | TOPICAL_OINTMENT | CUTANEOUS | Status: DC | PRN

## 2023-09-29 MED ORDER — ACETAMINOPHEN 500 MG PO TABS
1000.0000 mg | ORAL_TABLET | Freq: Four times a day (QID) | ORAL | Status: DC
  Administered 2023-09-29 – 2023-10-01 (×8): 1000 mg via ORAL
  Filled 2023-09-29 (×8): qty 2

## 2023-09-29 MED ORDER — METHYLERGONOVINE MALEATE 0.2 MG/ML IJ SOLN
INTRAMUSCULAR | Status: AC
Start: 2023-09-29 — End: 2023-09-29
  Filled 2023-09-29: qty 1

## 2023-09-29 MED ORDER — EPHEDRINE SULFATE-NACL 50-0.9 MG/10ML-% IV SOSY
PREFILLED_SYRINGE | INTRAVENOUS | Status: DC | PRN
  Administered 2023-09-29: 10 mg via INTRAVENOUS

## 2023-09-29 MED ORDER — GABAPENTIN 300 MG PO CAPS
300.0000 mg | ORAL_CAPSULE | ORAL | Status: AC
  Administered 2023-09-29: 300 mg via ORAL
  Filled 2023-09-29: qty 1

## 2023-09-29 MED ORDER — DIPHENHYDRAMINE HCL 50 MG/ML IJ SOLN: 12.5000 mg | INTRAMUSCULAR | Status: DC | PRN

## 2023-09-29 MED ORDER — SIMETHICONE 80 MG PO CHEW
80.0000 mg | CHEWABLE_TABLET | Freq: Three times a day (TID) | ORAL | Status: DC
  Administered 2023-09-29 – 2023-09-30 (×3): 80 mg via ORAL
  Filled 2023-09-29 (×3): qty 1

## 2023-09-29 MED ORDER — DIPHENHYDRAMINE HCL 25 MG PO CAPS: 25.0000 mg | ORAL_CAPSULE | ORAL | Status: DC | PRN

## 2023-09-29 MED ORDER — SIMETHICONE 80 MG PO CHEW: 80.0000 mg | CHEWABLE_TABLET | ORAL | Status: DC | PRN

## 2023-09-29 MED ORDER — LACTATED RINGERS IV SOLN
INTRAVENOUS | Status: DC | PRN
Start: 2023-09-29 — End: 2023-09-29

## 2023-09-29 MED ORDER — EPHEDRINE 5 MG/ML INJ
INTRAVENOUS | Status: AC
  Filled 2023-09-29: qty 5

## 2023-09-29 MED ORDER — CEFAZOLIN SODIUM-DEXTROSE 2-4 GM/100ML-% IV SOLN
2.0000 g | Freq: Once | INTRAVENOUS | Status: AC
  Administered 2023-09-29: 2 g via INTRAVENOUS
  Filled 2023-09-29: qty 100

## 2023-09-29 MED ORDER — DIPHENHYDRAMINE HCL 25 MG PO CAPS: 25.0000 mg | ORAL_CAPSULE | Freq: Four times a day (QID) | ORAL | Status: DC | PRN

## 2023-09-29 MED ORDER — ORAL CARE MOUTH RINSE: 15.0000 mL | Freq: Once | OROMUCOSAL | Status: DC

## 2023-09-29 MED ORDER — IBUPROFEN 600 MG PO TABS: 600.0000 mg | ORAL_TABLET | Freq: Four times a day (QID) | ORAL | Status: DC

## 2023-09-29 MED ORDER — ACETAMINOPHEN 10 MG/ML IV SOLN
INTRAVENOUS | Status: AC
  Filled 2023-09-29: qty 100

## 2023-09-29 MED ORDER — KETOROLAC TROMETHAMINE 30 MG/ML IJ SOLN
INTRAMUSCULAR | Status: AC
  Filled 2023-09-29: qty 1

## 2023-09-29 MED ORDER — MENTHOL 3 MG MT LOZG: 1.0000 | LOZENGE | OROMUCOSAL | Status: DC | PRN

## 2023-09-29 MED ORDER — KETOROLAC TROMETHAMINE 30 MG/ML IJ SOLN
30.0000 mg | Freq: Four times a day (QID) | INTRAMUSCULAR | Status: DC
  Administered 2023-09-29 (×2): 30 mg via INTRAVENOUS
  Filled 2023-09-29 (×2): qty 1

## 2023-09-29 MED ORDER — OXYCODONE HCL 5 MG PO TABS: 5.0000 mg | ORAL_TABLET | ORAL | Status: DC | PRN

## 2023-09-29 MED ORDER — CHLORHEXIDINE GLUCONATE 0.12 % MT SOLN
15.0000 mL | Freq: Once | OROMUCOSAL | Status: DC
  Filled 2023-09-29: qty 15

## 2023-09-29 MED ORDER — BUPIVACAINE HCL (PF) 0.25 % IJ SOLN
INTRAMUSCULAR | Status: DC | PRN
  Administered 2023-09-29: 60 mL

## 2023-09-29 MED ORDER — COCONUT OIL OIL: 1.0000 | TOPICAL_OIL | Status: DC | PRN

## 2023-09-29 MED ORDER — PHENYLEPHRINE HCL-NACL 20-0.9 MG/250ML-% IV SOLN
INTRAVENOUS | Status: AC
  Filled 2023-09-29: qty 250

## 2023-09-29 MED ORDER — BUPIVACAINE HCL (PF) 0.25 % IJ SOLN
INTRAMUSCULAR | Status: AC
  Filled 2023-09-29: qty 60

## 2023-09-29 SURGICAL SUPPLY — 30 items
BARRIER ADHS 3X4 INTERCEED (GAUZE/BANDAGES/DRESSINGS) ×1 IMPLANT
CHLORAPREP W/TINT 26 (MISCELLANEOUS) ×1 IMPLANT
DRESSING TELFA 8X10 (GAUZE/BANDAGES/DRESSINGS) IMPLANT
DRSG TELFA 3X8 NADH STRL (GAUZE/BANDAGES/DRESSINGS) ×1 IMPLANT
ELECT CAUTERY BLADE 6.4 (BLADE) ×1 IMPLANT
ELECT REM PT RETURN 9FT ADLT (ELECTROSURGICAL) ×1 IMPLANT
ELECTRODE REM PT RTRN 9FT ADLT (ELECTROSURGICAL) ×1 IMPLANT
GAUZE SPONGE 4X4 12PLY STRL (GAUZE/BANDAGES/DRESSINGS) ×1 IMPLANT
GAUZE SPONGE 4X4 12PLY STRL LF (GAUZE/BANDAGES/DRESSINGS) IMPLANT
GLOVE SURG SYN 8.0 (GLOVE) ×1 IMPLANT
GLOVE SURG SYN 8.0 PF PI (GLOVE) ×1 IMPLANT
GOWN STRL REUS W/ TWL LRG LVL3 (GOWN DISPOSABLE) ×2 IMPLANT
GOWN STRL REUS W/ TWL XL LVL3 (GOWN DISPOSABLE) ×1 IMPLANT
MANIFOLD NEPTUNE II (INSTRUMENTS) ×1 IMPLANT
MAT PREVALON FULL STRYKER (MISCELLANEOUS) ×1 IMPLANT
NDL HYPO 22X1.5 SAFETY MO (MISCELLANEOUS) ×1 IMPLANT
NEEDLE HYPO 22X1.5 SAFETY MO (MISCELLANEOUS) ×1 IMPLANT
NS IRRIG 1000ML POUR BTL (IV SOLUTION) ×1 IMPLANT
PACK C SECTION AR (MISCELLANEOUS) ×1 IMPLANT
PAD OB MATERNITY 11 LF (PERSONAL CARE ITEMS) ×1 IMPLANT
PAD PREP OB/GYN DISP 24X41 (PERSONAL CARE ITEMS) ×1 IMPLANT
SCRUB CHG 4% DYNA-HEX 4OZ (MISCELLANEOUS) ×1 IMPLANT
STRAP SAFETY 5IN WIDE (MISCELLANEOUS) ×1 IMPLANT
SUCT VACUUM KIWI BELL (SUCTIONS) IMPLANT
SUT CHROMIC 1 CTX 36 (SUTURE) ×3 IMPLANT
SUT PLAIN GUT 0 (SUTURE) ×2 IMPLANT
SUT VIC AB 0 CT1 36 (SUTURE) ×2 IMPLANT
SYR 30ML LL (SYRINGE) ×2 IMPLANT
TRAP FLUID SMOKE EVACUATOR (MISCELLANEOUS) ×1 IMPLANT
WATER STERILE IRR 500ML POUR (IV SOLUTION) ×1 IMPLANT

## 2023-09-29 NOTE — Brief Op Note (Signed)
 09/29/2023  8:58 AM  PATIENT:  Angela Terry  33 y.o. female  PRE-OPERATIVE DIAGNOSIS: elective  repeat cesarean 39+2  POST-OPERATIVE DIAGNOSIS:  same   PROCEDURE:   Repeat LTCS  SURGEON:  Surgeons and Role:    * Jerusalem Wert, Joselyn Nicely, MD - Primary  PHYSICIAN ASSISTANT: Arzella Laurence , CNM   ASSISTANTS: cst   ANESTHESIA:   spinal  EBL: qbl 550 iof 1400 cc uo 150 cc  BLOOD ADMINISTERED:none  DRAINS: Urinary Catheter (Foley)   LOCAL MEDICATIONS USED:  MARCAINE      SPECIMEN:  No Specimen  DISPOSITION OF SPECIMEN:  N/A  COUNTS:  YES  TOURNIQUET:  * No tourniquets in log *  DICTATION: .Other Dictation: Dictation Number verbal  PLAN OF CARE: Admit to inpatient   PATIENT DISPOSITION:  PACU - hemodynamically stable.   Delay start of Pharmacological VTE agent (>24hrs) due to surgical blood loss or risk of bleeding: not applicable

## 2023-09-29 NOTE — Op Note (Signed)
 NAME: Angela Terry, Angela Terry MEDICAL RECORD NO: [id number] ACCOUNT NO: [id number] DATE OF BIRTH: 07-Jul-1990 FACILITY: [facility] LOCATION: [facility] PHYSICIAN: Debby DOROTHA Dinsmore, MD  Operative Report   DATE OF PROCEDURE: 09/29/2023  PREOPERATIVE DIAGNOSES: 1.  39+2 weeks estimated gestational age. 2.  Previous cesarean section x2, elects for repeat. 3.  A2 gestational diabetic - high-risk pregnancy.  POSTOPERATIVE DIAGNOSES: 1.  39+2 weeks estimated gestational age. 2.  Previous cesarean section x2, elects for repeat. 3.  A2 gestational diabetic - high-risk pregnancy. 4.  Vigorous female delivered.  PROCEDURE:  Elective repeat low transverse cesarean section.  SURGEON:  Debby DOROTHA Dinsmore, MD  FIRST ASSISTANT:  Bobbette Brunswick, certified nurse midwife.  ANESTHESIA:  Spinal.  INDICATIONS:  A 33 year old gravida 4, para 2 patient with two prior cesarean sections and has elected for a repeat cesarean section.  The patient declines sterilization.  DESCRIPTION OF PROCEDURE:  After adequate spinal anesthesia, the patient was placed in the dorsal supine position with the hip roll on the right side.  The patient's abdomen and vagina were prepped and draped in normal sterile fashion.  The patient did  receive 2 g of IV Ancef  prior to surgery for surgical prophylaxis.  Timeout was performed.  A Pfannenstiel incision was made two fingerbreadths above the symphysis pubis.  Sharp dissection was used to identify the fascia.  The fascia was opened in the  midline and opened in a transverse fashion.  The superior aspect of the fascia was grasped with Kocher clamps, and the recti muscles were dissected free.  The inferior aspect of the fascia was grasped with Kocher clamps, and the pyramidalis muscle was  dissected free.  Entry into the peritoneal cavity was accomplished sharply.  The vesicouterine peritoneal fold was identified and was attenuated.  Therefore, a direct low uterine  transverse incision was made.  Upon entry into the endometrial cavity,  clear fluid resulted.  The incision was extended with blunt transverse traction.  The large fetal head was brought to the incision, and a Kiwi bell vacuum was applied to the occiput with one gentle pull on the flexion portion of the occiput.  The head  was delivered, and the vacuum was removed.  A loose nuchal cord was reduced.  The shoulders and body were delivered.  The vigorous female was then dried on the mother's abdomen for 60 seconds, and the cord was doubly clamped.  I passed the nursery staff,  who assigned Apgar scores of 8 and 9, fetal weight 3320 grams female.  Cord blood was obtained, and the placenta was manually delivered.  The uterus was exteriorized, and the endometrial cavity was wiped clean with laparotomy tape.  The uterine incision  was closed with 1 chromic suture in a running locking fashion.  Good approximation of the edges.  Good hemostasis was noted.  Fallopian tubes and ovaries appeared normal.  The posterior cul-de-sac was irrigated and suctioned.  The uterus was placed back  into the abdominal cavity, and the pericolic gutters were wiped clean with laparotomy tape.  The uterine incision again appeared hemostatic, and Interceed was placed over the uterine incision in a T-shaped fashion.  The fascia was then closed with 0  Vicryl suture in a running non-locking fashion.  The fascial edges were injected with 0.25% Marcaine  60 mL plus 20 mL normal saline.  30 mL of this solution was injected.  Subcutaneous tissues were irrigated and Bovie for hemostasis, and the skin was  reapproximated with Insorb absorbable staples.  Good cosmetic effect.  Good hemostasis.  The patient did receive 30 mg of intravenous Toradol  at the end of the procedure.  There were no complications.  QUANTITATIVE BLOOD LOSS:  550 mL.  INTRAOPERATIVE FLUIDS:  1400 mL  URINE OUTPUT:  150 mL.  DISPOSITION:  The patient tolerated the  procedure well and was taken to the recovery room in good condition.   PUS D: 09/29/2023 9:36:32 am T: 09/29/2023 5:12:00 pm  JOB: [id number]/ [id number]

## 2023-09-29 NOTE — Discharge Summary (Signed)
 Obstetrical Discharge Summary  Patient Name: Angela Terry DOB: 12/25/1990 MRN: [id number]  Date of Admission: 09/29/2023 Date of Delivery: 09/29/23 Delivered by: Eustace Highland MD Date of Discharge: 10/01/23  Primary OB: [facility]  [id number] last menstrual period was 12/24/2022 (exact date). EDC Estimated Date of Delivery: 10/04/23 Gestational Age at Delivery: [redacted]w[redacted]d   Antepartum complications: a2 gdm on metformin   Admitting Diagnosis: elective repeat cesarean Secondary Diagnosis: Patient Active Problem List   Diagnosis Date Noted   Cesarean delivery delivered 10/01/2023   Previous cesarean delivery affecting pregnancy 09/29/2023   Pregnancy 09/29/2023   Post-operative state 09/29/2023   Gestational diabetes mellitus (GDM) affecting pregnancy 07/24/23 @29  wks 07/25/2023   Supervision of other high risk pregnancy, antepartum 03/20/2023   History of C-section x2 03/20/2023   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    Augmentation: N/A Complications: None Intrapartum complications/course:  Date of Delivery: 10/01/2023  Delivered By: Eustace Highland MD Delivery Type: repeat cesarean section, low transverse incision Anesthesia: spinal Placenta: manual Laceration:  Episiotomy: none Newborn Data: Live born newborn   APGAR: ,8/9 female  weight 3320 gm    Newborn Delivery   Birth date/time:  Delivery type: C-Section, Low Transverse Trial of labor: No C-section categorization: Repeat       Postpartum Procedures: Venofer   Edinburgh:     09/30/2023    4:19 PM 03/20/2023    9:19 AM 04/15/2019   11:18 AM 04/15/2019   10:56 AM 02/24/2019    8:45 AM  Edinburgh Postnatal Depression Scale Screening Tool  I have been able to laugh and see the funny side of things. 0 0 0 0 0  I have looked forward with enjoyment to things. 0 0 0 0 0  I have blamed myself unnecessarily when things went wrong. 0 0 0 0 0  I have been anxious or worried for no good  reason. 0 0 0 0 0  I have felt scared or panicky for no good reason. 0 0 0 0 0  Things have been getting on top of me. 0 0  0 0  I have been so unhappy that I have had difficulty sleeping. 0 0 0 0 0  I have felt sad or miserable. 0 0 0 0 0  I have been so unhappy that I have been crying. 0 0 0 0 0  The thought of harming myself has occurred to me. 0 0 0 0 0  Edinburgh Postnatal Depression Scale Total 0 0  0 0    Post partum course:  Patient had an uncomplicated postpartum course.  By time of discharge on POD#2, her pain was controlled on oral pain medications; she had appropriate lochia and was ambulating, voiding without difficulty, tolerating regular diet and passing flatus.   She was deemed stable for discharge to home.    Discharge Physical Exam:  BP (!) 93/59 (BP Location: Right Arm)   Pulse 77   Temp 97.6 F (36.4 C) (Oral)   Resp 17   Ht 4' 7.12" (1.4 m)   Wt 56.7 kg   LMP 12/24/2022 (Exact Date)   SpO2 94%   Breastfeeding Unknown   BMI 28.93 kg/m   General: NAD CV: RRR Pulm: nl effort ABD: s/nd/nt, fundus firm and below the umbilicus Lochia: moderate Incision: c/d/i DVT Evaluation: LE non-ttp, no evidence of DVT on exam.  Hemoglobin  Date Value Ref Range Status  09/30/2023 9.2 (L) 12.0 - 15.0 g/dL Final  96/29/5284 13.2 11.1 - 15.9 g/dL  Final  10/08/2018 11.6  Final   HCT  Date Value Ref Range Status  09/30/2023 26.2 (L) 36.0 - 46.0 % Final  10/08/2018 34 29 - 41 Final   Hematocrit  Date Value Ref Range Status  03/20/2023 39.9 34.0 - 46.6 % Final     Disposition: stable, discharge to home. Baby Feeding: formula Baby Disposition: home with mom  Rh Immune globulin given:  Rubella vaccine given:  Tdap vaccine given in AP or PP setting:  Flu vaccine given in AP or PP setting:   Contraception: Depo  Risk assessment for postpartum VTE and prophylactic treatment: Very high risk factors: None High risk factors: None Moderate risk factors:  None  Postpartum VTE prophylaxis with LMWH not indicated  Prenatal Labs:   ABO, Rh: O/Positive/-- (10/07 1115) Antibody: Negative (10/07 1115) Rubella: 3.19 (10/07 1115) RPR: Non Reactive (02/03 1133)  HBsAg: Negative (10/07 1115) hep C : NR HIV: Non Reactive (10/07 1115)  GBS: Negative/-- (03/31 1005)     Plan:  Cristy Doom was discharged to home in good condition. Follow-up appointment with delivering provider in 6 weeks.  Discharge Medications: Allergies as of 10/01/2023   No Known Allergies      Medication List     STOP taking these medications    Blood Glucose Monitoring Suppl Devi   BLOOD GLUCOSE TEST STRIPS Strp   Lancets Misc   metFORMIN  500 MG tablet Commonly known as: GLUCOPHAGE        TAKE these medications    acetaminophen  500 MG tablet Commonly known as: TYLENOL  Take 500 mg by mouth every 6 (six) hours as needed for moderate pain (pain score 4-6).   ferrous sulfate  325 (65 FE) MG tablet Take 1 tablet (325 mg total) by mouth daily with breakfast. For anemia, take with Vitamin C   ibuprofen  600 MG tablet Commonly known as: ADVIL  Take 1 tablet (600 mg total) by mouth every 6 (six) hours as needed.   oxyCODONE  5 MG immediate release tablet Commonly known as: Oxy IR/ROXICODONE  Take 1 tablet (5 mg total) by mouth every 6 (six) hours as needed for moderate pain (pain score 4-6).   Prenatal Vitamin 27-0.8 MG Tabs Take 1 tablet by mouth daily.         Follow-up Information     Schermerhorn, Joselyn Nicely, MD. Schedule an appointment as soon as possible for a visit in 2 week(s).   Specialty: Obstetrics and Gynecology Why: For post op appointment Contact information: [address] [place] [address] [phone]         [facility] DEPT. Schedule an appointment as soon as possible for a visit in 6 week(s).   Contact information: [address] [place]  [place]  [phone]                Signed: Aron Lard 10/01/2023 9:30 AM

## 2023-09-29 NOTE — Transfer of Care (Signed)
 Immediate Anesthesia Transfer of Care Note  Patient: Angela Terry  Procedure(s) Performed: REPEAT CESAREAN SECTION WITH POSSIBLE BILATERAL TUBAL LIGATION IF EXCESSIVE SCAR TISSUE (Bilateral)  Patient Location: PACU  Anesthesia Type:Spinal  Level of Consciousness: awake, alert , and oriented  Airway & Oxygen Therapy: Patient Spontanous Breathing  Post-op Assessment: Report given to RN and Post -op Vital signs reviewed and stable  Post vital signs: stable  Last Vitals:  Vitals Value Taken Time  BP 115/64   Temp    Pulse 69   Resp 16   SpO2 100     Last Pain:  Vitals:   09/29/23 0728  TempSrc:   PainSc: 4       Patients Stated Pain Goal: 0 (09/29/23 0604)  Complications: No notable events documented.

## 2023-09-29 NOTE — Anesthesia Procedure Notes (Signed)
 Spinal  Start time: 09/29/2023 7:53 AM End time: 09/29/2023 7:53 AM Staffing Performed: resident/CRNA  Performed by: Forestine Igo, CRNA Authorized by: Dorothey Gate, MD   Preanesthetic Checklist Completed: patient identified, IV checked, site marked, risks and benefits discussed, surgical consent, monitors and equipment checked, pre-op evaluation and timeout performed Spinal Block Patient position: sitting Prep: ChloraPrep Patient monitoring: heart rate Approach: midline Location: L4-5 Needle Needle type: Pencil-Tip  Needle gauge: 24 G Needle length: 9 cm Assessment Sensory level: T4 Events: CSF return

## 2023-09-29 NOTE — Progress Notes (Signed)
 39+2 weeks . Elective repeat LTCS . Pt declines BTL . Reconfirmed today . LAbs reviewed. Npo . Proceed

## 2023-09-29 NOTE — Anesthesia Preprocedure Evaluation (Addendum)
 Anesthesia Evaluation  Patient identified by MRN, date of birth, ID band Patient awake    Reviewed: Allergy & Precautions, NPO status , Patient's Chart, lab work & pertinent test results  History of Anesthesia Complications Negative for: history of anesthetic complications  Airway Mallampati: I   Neck ROM: Full    Dental no notable dental hx.    Pulmonary neg pulmonary ROS   Pulmonary exam normal breath sounds clear to auscultation       Cardiovascular Exercise Tolerance: Good negative cardio ROS Normal cardiovascular exam Rhythm:Regular Rate:Normal     Neuro/Psych negative neurological ROS     GI/Hepatic negative GI ROS,,,  Endo/Other  diabetes, Gestational    Renal/GU negative Renal ROS     Musculoskeletal   Abdominal   Peds  Hematology negative hematology ROS (+)   Anesthesia Other Findings 33 yo [id number] at 3 2/7 presenting for planned repeat c-section.  Reproductive/Obstetrics (+) Pregnancy                             Anesthesia Physical Anesthesia Plan  ASA: 2  Anesthesia Plan: Spinal   Post-op Pain Management:    Induction:   PONV Risk Score and Plan: 2 and Ondansetron  and Treatment may vary due to age or medical condition  Airway Management Planned: Natural Airway and Nasal Cannula  Additional Equipment:   Intra-op Plan:   Post-operative Plan:   Informed Consent: I have reviewed the patients History and Physical, chart, labs and discussed the procedure including the risks, benefits and alternatives for the proposed anesthesia with the patient or authorized representative who has indicated his/her understanding and acceptance.     Dental Advisory Given  Plan Discussed with: Anesthesiologist, CRNA and Surgeon  Anesthesia Plan Comments: (History and consent obtained in Spanish. Patient reports no bleeding problems and no anticoagulant use.  Plan for spinal  with backup GA.  Patient consented for risks of anesthesia including but not limited to:  - adverse reactions to medications - damage to eyes, teeth, lips or other oral mucosa - nerve damage due to positioning  - risk of bleeding, infection and or nerve damage from spinal that could lead to paralysis - risk of headache or failed spinal - damage to teeth, lips or other oral mucosa - sore throat or hoarseness - damage to heart, brain, nerves, lungs, other parts of body or loss of life  Patient voiced understanding and assent.)        Anesthesia Quick Evaluation

## 2023-09-30 LAB — CBC
HCT: 26.2 % — ABNORMAL LOW (ref 36.0–46.0)
Hemoglobin: 9.2 g/dL — ABNORMAL LOW (ref 12.0–15.0)
MCH: 31 pg (ref 26.0–34.0)
MCHC: 35.1 g/dL (ref 30.0–36.0)
MCV: 88.2 fL (ref 80.0–100.0)
Platelets: 239 10*3/uL (ref 150–400)
RBC: 2.97 MIL/uL — ABNORMAL LOW (ref 3.87–5.11)
RDW: 14.1 % (ref 11.5–15.5)
WBC: 14.5 10*3/uL — ABNORMAL HIGH (ref 4.0–10.5)
nRBC: 0 % (ref 0.0–0.2)

## 2023-09-30 LAB — GLUCOSE, CAPILLARY: Glucose-Capillary: 77 mg/dL (ref 70–99)

## 2023-09-30 MED ORDER — KETOROLAC TROMETHAMINE 30 MG/ML IJ SOLN
30.0000 mg | Freq: Four times a day (QID) | INTRAMUSCULAR | Status: AC
  Administered 2023-09-30: 30 mg via INTRAVENOUS
  Filled 2023-09-30 (×2): qty 1

## 2023-09-30 MED ORDER — IBUPROFEN 600 MG PO TABS
600.0000 mg | ORAL_TABLET | Freq: Four times a day (QID) | ORAL | Status: DC
Start: 2023-09-30 — End: 2023-10-01
  Administered 2023-09-30 – 2023-10-01 (×4): 600 mg via ORAL
  Filled 2023-09-30 (×4): qty 1

## 2023-09-30 MED ORDER — IRON SUCROSE 300 MG IVPB - SIMPLE MED
300.0000 mg | Freq: Once | Status: AC
  Administered 2023-09-30: 300 mg via INTRAVENOUS
  Filled 2023-09-30: qty 300

## 2023-09-30 NOTE — Anesthesia Post-op Follow-up Note (Signed)
  Anesthesia Pain Follow-up Note  Patient: Angela Terry  Day #: 1  Date of Follow-up: 09/30/2023 Time: 11:18 AM  Last Vitals:  Vitals:   09/30/23 0340 09/30/23 0856  BP: (!) 85/54 92/64  Pulse: 75 78  Resp: 20 18  Temp: 36.9 C 36.8 C  SpO2: 98% 97%    Level of Consciousness: alert  Pain: mild   Side Effects:None  Catheter Site Exam:clean, dry  Anti-Coag Meds (From admission, onward)    Start     Dose/Rate Route Frequency Ordered Stop   09/30/23 0800  enoxaparin  (LOVENOX ) injection 40 mg        40 mg Subcutaneous Every 24 hours 09/29/23 1242          Plan: D/C from anesthesia care at surgeon's request  Vanice Genre

## 2023-09-30 NOTE — Anesthesia Postprocedure Evaluation (Signed)
 Anesthesia Post Note  Patient: Angela Terry  Procedure(s) Performed: REPEAT CESAREAN SECTION WITH POSSIBLE BILATERAL TUBAL LIGATION IF EXCESSIVE SCAR TISSUE (Bilateral)  Patient location during evaluation: Mother Baby Anesthesia Type: Spinal Level of consciousness: oriented and awake and alert Pain management: pain level controlled Vital Signs Assessment: post-procedure vital signs reviewed and stable Respiratory status: spontaneous breathing and respiratory function stable Cardiovascular status: blood pressure returned to baseline and stable Postop Assessment: no headache, no backache, no apparent nausea or vomiting and able to ambulate Anesthetic complications: no   No notable events documented.   Last Vitals:  Vitals:   09/30/23 0340 09/30/23 0856  BP: (!) 85/54 92/64  Pulse: 75 78  Resp: 20 18  Temp: 36.9 C 36.8 C  SpO2: 98% 97%    Last Pain:  Vitals:   09/30/23 0856  TempSrc: Oral  PainSc:                  Vanice Genre

## 2023-09-30 NOTE — TOC CM/SW Note (Signed)
 TOC spoke with RN who states patient is asking about Medicaid assistance.  CSW sent an encrypted email to [place] with [employer] - requested she follow up to assist MOB with this.  Neely Cecena, LCSW Transitions of Care Department [phone]

## 2023-09-30 NOTE — Progress Notes (Signed)
 Post Partum Day 1  Subjective: Doing well, no concerns. Ambulating without difficulty, pain managed with PO meds, tolerating regular diet, and voiding without difficulty.   No fever/chills, chest pain, shortness of breath, nausea/vomiting, or leg pain. No nipple or breast pain. No headache, visual changes, or RUQ/epigastric pain.  Objective: BP 92/64 (BP Location: Right Arm)   Pulse 78   Temp 98.2 F (36.8 C) (Oral)   Resp 18   Ht 4' 7.12" (1.4 m)   Wt 56.7 kg   LMP 12/24/2022 (Exact Date)   SpO2 97%   Breastfeeding Unknown   BMI 28.93 kg/m    Physical Exam:  General: alert and cooperative Breasts: soft/nontender CV: RRR Pulm: nl effort Abdomen: soft, non-tender Uterine Fundus: firm Incision: healing well, honeycomb dressing on and intact Perineum: minimal edema Lochia: appropriate DVT Evaluation: No evidence of DVT seen on physical exam. Edinburgh:     03/20/2023    9:19 AM 04/15/2019   11:18 AM 04/15/2019   10:56 AM 02/24/2019    8:45 AM  Edinburgh Postnatal Depression Scale Screening Tool  I have been able to laugh and see the funny side of things. 0 0 0 0  I have looked forward with enjoyment to things. 0 0 0 0  I have blamed myself unnecessarily when things went wrong. 0 0 0 0  I have been anxious or worried for no good reason. 0 0 0 0  I have felt scared or panicky for no good reason. 0 0 0 0  Things have been getting on top of me. 0  0 0  I have been so unhappy that I have had difficulty sleeping. 0 0 0 0  I have felt sad or miserable. 0 0 0 0  I have been so unhappy that I have been crying. 0 0 0 0  The thought of harming myself has occurred to me. 0 0 0 0  Edinburgh Postnatal Depression Scale Total 0  0 0     Recent Labs    09/30/23 0548  HGB 9.2*  HCT 26.2*  WBC 14.5*  PLT 239    Assessment/Plan: 32 y.o. [id number] postpartum day # 1  1. Continue routine postpartum care  2. Infant feeding status: formula feeding -Encouraged snug fitting bra, cold  application, Tylenol  PRN, and cabbage leaves for engorgement for formula feeding   3. Contraception plan: Depo-Provera   4. Acute blood loss anemia - clinically significant.  -Hemodynamically stable and asymptomatic -Intervention: IV iron  transfusion with venofer  ordered   5. Immunization status:   all immunizations up to date  6. A2GDM - D/c POC CBG order  08:24 (09/30/23) 1 d ago (09/29/23) 1 d ago (09/29/23) 1 d ago (09/29/23)  Glucose-Capillary 77 91 CM 100 High  CM 69 Low      Disposition: Continue inpatient postpartum care    LOS: 1 day   Yasmin Bronaugh, CNM 09/30/2023, 9:26 AM

## 2023-10-01 LAB — VARICELLA ZOSTER ANTIBODY, IGG: Varicella IgG: REACTIVE

## 2023-10-01 MED ORDER — FERROUS SULFATE 325 (65 FE) MG PO TABS: 325.0000 mg | ORAL_TABLET | Freq: Every day | ORAL | Status: AC

## 2023-10-01 MED ORDER — OXYCODONE HCL 5 MG PO TABS: 5.0000 mg | ORAL_TABLET | Freq: Four times a day (QID) | ORAL | 0 refills | Status: AC | PRN

## 2023-10-01 MED ORDER — IBUPROFEN 600 MG PO TABS: 600.0000 mg | ORAL_TABLET | Freq: Four times a day (QID) | ORAL | Status: AC | PRN

## 2023-10-01 NOTE — Progress Notes (Signed)
 AVS given and reviewed w/ interpreter. All questions answered.

## 2023-10-02 ENCOUNTER — Encounter: Payer: Self-pay | Admitting: Obstetrics and Gynecology

## 2023-11-19 NOTE — Progress Notes (Unsigned)
 [facility] [address], [address] [place] [address] Main phone: [phone]  Postpartum Visit  Subjective:  Fatimah Sundquist is a 33 y.o. [phone] female who presents for a postpartum visit. She is 7 weeks postpartum following a repeat cesarean section.  I have fully reviewed the prenatal and intrapartum course. Postpartum course has been good.   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   Anesthesia: spinal.  Delivery complications: None Patient describes her labor and delivery as good.   Infant Baby is doing well.  Baby is feeding by bottle - Similac Advance Baby has gained about 2.5 lbs per mother.  Baby has been to see pediatrician, as recently as 1 week ago, and has an appointment scheduled to return to pediatrician in 1 week for vaccines.  GI & GU Bleeding: no bleeding.  Bowel function is normal.  Bladder function is normal.  Reports bleeding from rectum at times. Last occurred 1 week ago. Reports a little bleeding that she notices on toilet tissue when wiping after having a bm. Reports when this happens the stool is hard and has difficulty passing stool.   Sexuality and Contraception Patient is not sexually active.  Contraception method is Depo-Provera  injections.   The pregnancy intention screening data noted above was reviewed. Potential methods of contraception were discussed. The patient elected to proceed with Hormonal  Injection.  Mood Postpartum depression screening: negative. Flowsheet Row Routine Prenatal from 09/11/2023 in [facility] Department  PHQ-9 Total Score 5       Edinburgh Postnatal Depression Scale - 11/20/23 1005       Edinburgh Postnatal Depression Scale:  In the Past 7 Days   I have been able to laugh and see the funny side of things. 0    I have looked forward with enjoyment to things. 0    I have blamed myself unnecessarily when things went wrong. 1    I have been anxious or worried for no good reason. 0    I have felt scared or panicky for no good reason. 0    Things have been getting on top of me. 0    I have been so unhappy that I have had difficulty sleeping. 0    I have felt sad or miserable. 0    I have been so unhappy that I have been crying. 0    The thought of harming myself has occurred to me. 0    Edinburgh Postnatal Depression Scale Total 1             Health Maintenance Health Maintenance Due  Topic Date Due   COVID-19 Vaccine (1 - 2024-25 season) Never done   Cervical Cancer Screening (HPV/Pap Cotest)  06/16/2023   The following portions of the patient's history were reviewed and updated as appropriate: allergies, current medications, past family history, past medical history, past social history, past surgical history, and problem list.  Review of Systems Pertinent items noted in HPI and remainder of comprehensive ROS otherwise negative.  Objective:  BP 95/64   Pulse 80   Temp 98.4 F (36.9 C)   Wt 110 lb (49.9 kg)   Breastfeeding No   BMI 25.46 kg/m    General:  alert, cooperative, and appears stated age   Breasts:  not indicated - not breast feeding. No concerns.  Lungs: clear to auscultation bilaterally  Heart:  regular rate and rhythm, S1, S2 normal, no murmur, click, rub or gallop  Abdomen: soft, non-tender; bowel sounds normal; no masses,  no organomegaly   Wound well approximated incision  GU exam:  not indicated        Assessment:    1. Postpartum care following cesarean delivery  Plan:   Essential components of care per ACOG recommendations:  1.  Mood and well being: Patient with negative depression screening today. Reviewed local resources for support.  - Patient tobacco use? No.   - hx of drug use? No.    2. Infant care and feeding:  -Patient currently breastmilk feeding? No.  -Social determinants of health (SDOH) reviewed in EPIC. No concerns were identified and patient states, "we are fine. Thank you."   3. Sexuality, contraception and birth spacing - Patient does not want a pregnancy in the next year.  Desired family size is 3 children.  - Reviewed reproductive life planning. Reviewed options based on patient desire and reproductive life plan. Patient is interested in Hormonal Injection. This was provided to the patient today. Patient reports NO SEX SINCE DELIVERY.   Risks, benefits, and typical effectiveness rates were reviewed.  Questions were answered.  Written information was also given to the patient to review.    The patient will follow up in  3 months for surveillance.  The patient was told to call with any further questions, or with any concerns about this method of contraception.  Emphasized use of condoms 100% of the time for STI prevention.  Emergency Contraception Precautions (ECP): Patient assessed for need of ECP. She is not a candidate based on no intercourse since delivery.   - Discussed birth spacing of 18 months  4. Sleep and fatigue -Encouraged family/partner/community support of 4 hrs of uninterrupted sleep to help with mood and fatigue.   5. Physical Recovery  - Discussed patients delivery and complications. She describes her labor as good. - Patient had a C-section repeat; no problems after deliver.  - Patient has urinary incontinence? No. - Patient is safe to resume physical and sexual activity - Rectal bleeding most consistent with hemorrhoid flare d/t  constipation. Encouraged increasing water intake and using Miralax. Patient verbalized understanding and agreed with plan.   6.  Health Maintenance - HM due items addressed Yes - Last pap smear:   Lab Results  Component Value Date   SPECADGYN Comment 06/15/2020   Result Date Procedure Results Follow-ups  06/15/2020 IGP, rfx Aptima HPV ASCU DIAGNOSIS:: Comment Specimen adequacy:: Comment Clinician Provided ICD10: Comment Performed by:: Comment PAP Smear Comment: . Note:: Comment Test Methodology: Comment PAP Reflex: Comment    - Pap smear not done at today's visit, as not indicated at this time.Next PAP due 06/16/2025.  -Breast Cancer screening indicated? No.   7. Chronic Disease/Pregnancy Condition follow up: Anemia and Gestational Diabetes  - PCP follow up  Due to language barrier, a Spanish interpreter Edgar Goods T.) was present in person during the history-taking, subsequent discussion, and physical exam with this patient.    Merleen Stare, NP [facility] Department

## 2023-11-20 ENCOUNTER — Encounter: Payer: Self-pay | Admitting: Nurse Practitioner

## 2023-11-20 ENCOUNTER — Ambulatory Visit: Payer: Self-pay

## 2023-11-20 DIAGNOSIS — Z3042 Encounter for surveillance of injectable contraceptive: Secondary | ICD-10-CM

## 2023-11-20 DIAGNOSIS — O24419 Gestational diabetes mellitus in pregnancy, unspecified control: Secondary | ICD-10-CM

## 2023-11-20 LAB — URINALYSIS
Bilirubin, UA: NEGATIVE
Glucose, UA: NEGATIVE
Ketones, UA: NEGATIVE
Nitrite, UA: NEGATIVE
Protein,UA: NEGATIVE
RBC, UA: NEGATIVE
Specific Gravity, UA: 1.015 (ref 1.005–1.030)
Urobilinogen, Ur: 0.2 mg/dL (ref 0.2–1.0)
pH, UA: 6 (ref 5.0–7.5)

## 2023-11-20 LAB — HEMOGLOBIN, FINGERSTICK: Hemoglobin: 13 g/dL (ref 11.1–15.9)

## 2023-11-20 MED ORDER — MEDROXYPROGESTERONE ACETATE 150 MG/ML IM SUSP
150.0000 mg | INTRAMUSCULAR | Status: AC
  Administered 2023-11-20 – 2024-07-09 (×4): 150 mg via INTRAMUSCULAR

## 2023-11-20 NOTE — Progress Notes (Signed)
 2 hour GTT appt scheduled for tomorrow am with arrival time of 0800. Client given appt reminder card. Test prep instructions provided with stated understanding and client able to verbalize instructions correctly. Ariel Begun, RN

## 2023-11-21 ENCOUNTER — Other Ambulatory Visit: Payer: Self-pay

## 2023-11-21 ENCOUNTER — Ambulatory Visit: Payer: Self-pay

## 2023-11-21 DIAGNOSIS — O24419 Gestational diabetes mellitus in pregnancy, unspecified control: Secondary | ICD-10-CM

## 2023-11-21 NOTE — Progress Notes (Signed)
 In nurse clinic for 2 hr GTT. Reports npo since 10 pm last night. Instructions reviewed for test today. Verbalizes understanding. [place] interpreter lang line # [phone]. Ander Wamser, RN

## 2023-11-22 LAB — GLUCOSE TOLERANCE, 2 HOURS
Glucose, 2 hour: 67 mg/dL — ABNORMAL LOW (ref 70–139)
Glucose, GTT - Fasting: 83 mg/dL (ref 70–99)

## 2023-11-23 ENCOUNTER — Ambulatory Visit: Payer: Self-pay | Admitting: Family Medicine

## 2023-11-23 NOTE — Progress Notes (Signed)
 Negative post partum 2 hr GTT.  No intervention needed.   Tempie Fee, MD 11/23/23  6:41 PM

## 2023-11-27 ENCOUNTER — Telehealth: Payer: Self-pay | Admitting: Family Medicine

## 2023-11-27 NOTE — Telephone Encounter (Signed)
 Returned patient phone Barista ID # [id number]. Patient told that Dr. Tanis Fan reviewed lab results and indicated that 2 Hour Glucose Test normal and no further intervention needed. Patient verbalized understanding and had no further questions. Gery Kubas RN

## 2024-02-08 ENCOUNTER — Ambulatory Visit (LOCAL_COMMUNITY_HEALTH_CENTER): Payer: Self-pay

## 2024-02-08 VITALS — BP 103/57 | Ht <= 58 in | Wt 112.0 lb

## 2024-02-08 DIAGNOSIS — Z3042 Encounter for surveillance of injectable contraceptive: Secondary | ICD-10-CM

## 2024-02-08 DIAGNOSIS — Z3009 Encounter for other general counseling and advice on contraception: Secondary | ICD-10-CM

## 2024-02-08 DIAGNOSIS — Z30013 Encounter for initial prescription of injectable contraceptive: Secondary | ICD-10-CM

## 2024-02-08 NOTE — Progress Notes (Signed)
 11 weeks and 3 days post depo. Voices no concerns. Depo given today per order by JINNY Narrow, NP dated 11/20/2023. Tolerated well in R deltoid. Next depo due 04/25/2024; patient aware.  Interpreter, Rufus [phone], helped during this visit.   Doyce CINDERELLA Shuck, RN

## 2024-04-25 ENCOUNTER — Ambulatory Visit (LOCAL_COMMUNITY_HEALTH_CENTER): Payer: Self-pay

## 2024-04-25 VITALS — BP 103/64 | Ht <= 58 in | Wt 113.0 lb

## 2024-04-25 DIAGNOSIS — Z30013 Encounter for initial prescription of injectable contraceptive: Secondary | ICD-10-CM

## 2024-04-25 DIAGNOSIS — Z3042 Encounter for surveillance of injectable contraceptive: Secondary | ICD-10-CM

## 2024-04-25 DIAGNOSIS — Z3009 Encounter for other general counseling and advice on contraception: Secondary | ICD-10-CM

## 2024-04-25 NOTE — Progress Notes (Signed)
 11weeks post last depo. Voices no concerns. Depo given today per order dated 11/20/23 J.Idol,NP. Tolerated well to L. Delt. Spanish interpreter M. [place] present during visit. Next depo due 07/11/24.Has reminder card.

## 2024-07-09 ENCOUNTER — Ambulatory Visit: Payer: Self-pay

## 2024-07-09 VITALS — BP 125/73 | Ht <= 58 in | Wt 118.0 lb

## 2024-07-09 DIAGNOSIS — Z3042 Encounter for surveillance of injectable contraceptive: Secondary | ICD-10-CM

## 2024-07-09 DIAGNOSIS — Z30013 Encounter for initial prescription of injectable contraceptive: Secondary | ICD-10-CM

## 2024-07-09 DIAGNOSIS — Z3009 Encounter for other general counseling and advice on contraception: Secondary | ICD-10-CM

## 2024-07-09 NOTE — Progress Notes (Signed)
 10 Weeks   5 Days since last Depo Interpreter: pac[place] interpreter [phone]   Voices no concerns today.  Counseled to adhere to 11 to 13 week intervals between depo injections for optimal benefit.  Depo given today per order by JINNY Narrow, FNP  dated 11/20/2023.  Tolerated well R delt.  Next depo due 09/24/2024,  has reminder card. Easten Maceachern, RN
